# Patient Record
Sex: Female | Born: 1940
Health system: Southern US, Community
[De-identification: ages and names within clinical notes are randomized; demographics above are authoritative.]

## PROBLEM LIST (undated history)

## (undated) DIAGNOSIS — I10 Essential (primary) hypertension: Secondary | ICD-10-CM

## (undated) DIAGNOSIS — F329 Major depressive disorder, single episode, unspecified: Secondary | ICD-10-CM

## (undated) DIAGNOSIS — F32A Depression, unspecified: Secondary | ICD-10-CM

## (undated) DIAGNOSIS — K5790 Diverticulosis of intestine, part unspecified, without perforation or abscess without bleeding: Secondary | ICD-10-CM

## (undated) DIAGNOSIS — E785 Hyperlipidemia, unspecified: Secondary | ICD-10-CM

## (undated) DIAGNOSIS — M1990 Unspecified osteoarthritis, unspecified site: Secondary | ICD-10-CM

## (undated) HISTORY — PX: BACK SURGERY: SHX140

## (undated) HISTORY — DX: Essential (primary) hypertension: I10

## (undated) HISTORY — PX: COLONOSCOPY: SHX174

## (undated) HISTORY — DX: Unspecified osteoarthritis, unspecified site: M19.90

## (undated) HISTORY — DX: Diverticulosis of intestine, part unspecified, without perforation or abscess without bleeding: K57.90

## (undated) HISTORY — DX: Depression, unspecified: F32.A

## (undated) HISTORY — DX: Hyperlipidemia, unspecified: E78.5

---
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
## 2001-09-01 ENCOUNTER — Encounter: Admission: RE | Admit: 2001-09-01 | Discharge: 2001-09-01 | Payer: Self-pay | Admitting: Obstetrics and Gynecology

## 2001-09-01 ENCOUNTER — Encounter: Payer: Self-pay | Admitting: Obstetrics and Gynecology

## 2001-09-22 ENCOUNTER — Ambulatory Visit (HOSPITAL_COMMUNITY): Admission: RE | Admit: 2001-09-22 | Discharge: 2001-09-22 | Payer: Self-pay | Admitting: *Deleted

## 2002-08-24 ENCOUNTER — Other Ambulatory Visit: Admission: RE | Admit: 2002-08-24 | Discharge: 2002-08-24 | Payer: Self-pay | Admitting: Gynecology

## 2003-10-02 ENCOUNTER — Other Ambulatory Visit: Admission: RE | Admit: 2003-10-02 | Discharge: 2003-10-02 | Payer: Self-pay | Admitting: Gynecology

## 2005-04-06 ENCOUNTER — Emergency Department (HOSPITAL_COMMUNITY): Admission: EM | Admit: 2005-04-06 | Discharge: 2005-04-06 | Payer: Self-pay | Admitting: Emergency Medicine

## 2005-04-21 ENCOUNTER — Inpatient Hospital Stay (HOSPITAL_COMMUNITY): Admission: RE | Admit: 2005-04-21 | Discharge: 2005-04-22 | Payer: Self-pay | Admitting: Neurological Surgery

## 2006-09-24 ENCOUNTER — Other Ambulatory Visit: Admission: RE | Admit: 2006-09-24 | Discharge: 2006-09-24 | Payer: Self-pay | Admitting: Gynecology

## 2010-05-28 ENCOUNTER — Encounter: Payer: Self-pay | Admitting: Gastroenterology

## 2010-05-30 ENCOUNTER — Encounter: Admission: RE | Admit: 2010-05-30 | Discharge: 2010-05-30 | Payer: Self-pay | Admitting: Gastroenterology

## 2010-11-06 ENCOUNTER — Encounter (INDEPENDENT_AMBULATORY_CARE_PROVIDER_SITE_OTHER): Payer: Self-pay | Admitting: *Deleted

## 2010-12-06 ENCOUNTER — Encounter (INDEPENDENT_AMBULATORY_CARE_PROVIDER_SITE_OTHER): Payer: Self-pay | Admitting: *Deleted

## 2010-12-11 ENCOUNTER — Ambulatory Visit
Admission: RE | Admit: 2010-12-11 | Discharge: 2010-12-11 | Payer: Self-pay | Source: Home / Self Care | Attending: Internal Medicine | Admitting: Internal Medicine

## 2010-12-16 ENCOUNTER — Telehealth (INDEPENDENT_AMBULATORY_CARE_PROVIDER_SITE_OTHER): Payer: Self-pay | Admitting: *Deleted

## 2011-01-09 NOTE — Miscellaneous (Signed)
Summary: [facility] Previsit/prep  Clinical Lists Changes  Medications: Added new medication of MOVIPREP 100 GM  SOLR (PEG-KCL-NACL-NASULF-NA ASC-C) As per prep instructions. - Signed Rx of MOVIPREP 100 GM  SOLR (PEG-KCL-NACL-NASULF-NA ASC-C) As per prep instructions.;  #1 x 0;  Signed;  Entered by: Wyona Almas RN;  Authorized by: Rachael Fee MD;  Method used: Electronically to [facility]  [address] [place], [place], [place]  [address], Ph: [phone], Fax: [phone] Observations: Added new observation of NKA: T (12/11/2010 8:35)    Prescriptions: MOVIPREP 100 GM  SOLR (PEG-KCL-NACL-NASULF-NA ASC-C) As per prep instructions.  #1 x 0   Entered by:   Wyona Almas RN   Authorized by:   Rachael Fee MD   Signed by:   Wyona Almas RN on 12/11/2010   Method used:   Electronically to        [facility]  [address] [facility] (retail)       [address]       [place], [place]  [address]       Ph: [phone]       Fax: [phone]   RxID:   [id number]   Appended Document: [facility] Previsit/prep records release sent to [facility] for records

## 2011-01-09 NOTE — Letter (Signed)
Summary: Pre Visit Letter Revised  [facility]  [address] [place], [place] [address]   Phone: [phone]  Fax: [phone]        11/06/2010 MRN: [id number] [facility] [address] [place], [place]  [address]             Procedure Date:  12/23/2010   Welcome to the Gastroenterology Division at [facility].    You are scheduled to see a nurse for your pre-procedure visit on 12/11/10 at 9:00 AM on the 3rd floor at [facility], [address]  We ask that you try to arrive at our office 15 minutes prior to your appointment time to allow for check-in.  Please take a minute to review the attached form.  If you answer "Yes" to one or more of the questions on the first page, we ask that you call the person listed at your earliest opportunity.  If you answer "No" to all of the questions, please complete the rest of the form and bring it to your appointment.    Your nurse visit will consist of discussing your medical and surgical history, your immediate family medical history, and your medications.   If you are unable to list all of your medications on the form, please bring the medication bottles to your appointment and we will list them.  We will need to be aware of both prescribed and over the counter drugs.  We will need to know exact dosage information as well.    Please be prepared to read and sign documents such as consent forms, a financial agreement, and acknowledgement forms.  If necessary, and with your consent, a friend or relative is welcome to sit-in on the nurse visit with you.  Please bring your insurance card so that we may make a copy of it.  If your insurance requires a referral to see a specialist, please bring your referral form from your primary care physician.  No co-pay is required for this nurse visit.     If you cannot keep your appointment, please call [phone] to cancel or reschedule prior to your appointment date.  This allows [place] the  opportunity to schedule an appointment for another patient in need of care.    Thank you for choosing [facility] for your medical needs.  We appreciate the opportunity to care for you.  Please visit [place] at our website  to learn more about our practice.  Sincerely, The Gastroenterology Division

## 2011-01-09 NOTE — Letter (Signed)
Summary: [facility]  [facility]   Imported By: Sherian Rein 12/24/2010 11:05:05  _____________________________________________________________________  External Attachment:    Type:   Image     Comment:   External Document

## 2011-01-09 NOTE — Letter (Signed)
Summary: [facility] Instructions  [facility]  [address] [place], [place] [address]   Phone: [phone]  Fax: [phone]       Claire Garcia    [birth year]    MRN: [id number]        Procedure Day Dorna Bloom: [facility]  02/05/11     Arrival Time: 9:30AM AM     Procedure Time: 10:30AM     Location of Procedure:                    _x _  [facility] (4th Floor)                        PREPARATION FOR COLONOSCOPY WITH MOVIPREP   Starting 5 days prior to your procedure 01/31/11  do not eat nuts, seeds, popcorn, corn, beans, peas,  salads, or any raw vegetables.  Do not take any fiber supplements (e.g. Metamucil, Citrucel, and Benefiber).  THE DAY BEFORE YOUR PROCEDURE         DATE:  02/04/11   DAY:  TUESDAY  1.  Drink clear liquids the entire day-NO SOLID FOOD  2.  Do not drink anything colored red or purple.  Avoid juices with pulp.  No orange juice.  3.  Drink at least 64 oz. (8 glasses) of fluid/clear liquids during the day to prevent dehydration and help the prep work efficiently.  CLEAR LIQUIDS INCLUDE: Water Jello Ice Popsicles Tea (sugar ok, no milk/cream) Powdered fruit flavored drinks Coffee (sugar ok, no milk/cream) Gatorade Juice: apple, white grape, white cranberry  Lemonade Clear bullion, consomm, broth Carbonated beverages (any kind) Strained chicken noodle soup Hard Candy                             4.  In the morning, mix first dose of MoviPrep solution:    Empty 1 Pouch A and 1 Pouch B into the disposable container    Add lukewarm drinking water to the top line of the container. Mix to dissolve    Refrigerate (mixed solution should be used within 24 hrs)  5.  Begin drinking the prep at 5:00 p.m. The MoviPrep container is divided by 4 marks.   Every 15 minutes drink the solution down to the next mark (approximately 8 oz) until the full liter is complete.   6.  Follow completed prep with 16 oz of clear liquid of your choice  (Nothing red or purple).  Continue to drink clear liquids until bedtime.  7.  Before going to bed, mix second dose of MoviPrep solution:    Empty 1 Pouch A and 1 Pouch B into the disposable container    Add lukewarm drinking water to the top line of the container. Mix to dissolve    Refrigerate  THE DAY OF YOUR PROCEDURE      DATE:  02/05/11  DAY:  WEDNESDAY  Beginning at 5:30AM (5 hours before procedure):         1. Every 15 minutes, drink the solution down to the next mark (approx 8 oz) until the full liter is complete.  2. Follow completed prep with 16 oz. of clear liquid of your choice.    3. You may drink clear liquids until  8:30AM  (2 HOURS BEFORE PROCEDURE).   MEDICATION INSTRUCTIONS  Unless otherwise instructed, you should take regular prescription medications with a small sip of water  as early as possible the morning of your procedure.        OTHER INSTRUCTIONS  You will need a responsible adult at least 69 years of age to accompany you and drive you home.   This person must remain in the waiting room during your procedure.  Wear loose fitting clothing that is easily removed.  Leave jewelry and other valuables at home.  However, you may wish to bring a book to read or  an iPod/MP3 player to listen to music as you wait for your procedure to start.  Remove all body piercing jewelry and leave at home.  Total time from sign-in until discharge is approximately 2-3 hours.  You should go home directly after your procedure and rest.  You can resume normal activities the  day after your procedure.  The day of your procedure you should not:   Drive   Make legal decisions   Operate machinery   Drink alcohol   Return to work  You will receive specific instructions about eating, activities and medications before you leave.    The above instructions have been reviewed and explained to me by   Wyona Almas RN  December 11, 2010 9:27 AM     I fully  understand and can verbalize these instructions _____________________________ Date _________

## 2011-01-09 NOTE — Progress Notes (Signed)
Summary: Dr switch  Phone Note Outgoing Call   Call placed by: Chales Abrahams CMA Duncan Dull),  December 16, 2010 8:20 AM Summary of Call: Placed a call to the pt  to inquire about the switch from Dr Bosie Clos.  She originally saw Dr Luther Parody and was scheduled with Dr Bosie Clos who she did not care for and would like to switch to Dr Christella Hartigan.  Her husband Myka Hitz is also a pt of Dr Christella Hartigan.  Dr Christella Hartigan do you ok the switch?  The records are on your desk. Initial call taken by: Chales Abrahams CMA Duncan Dull),  December 16, 2010 8:23 AM  Follow-up for Phone Call        yes, that is ok Follow-up by: Rachael Fee MD,  December 16, 2010 8:31 AM  Additional Follow-up for Phone Call Additional follow up Details #1::        pt will remain on the schedule Additional Follow-up by: Chales Abrahams CMA Duncan Dull),  December 16, 2010 8:39 AM

## 2011-02-05 ENCOUNTER — Other Ambulatory Visit: Payer: Self-pay | Admitting: Gastroenterology

## 2011-03-04 ENCOUNTER — Encounter: Payer: Self-pay | Admitting: Gastroenterology

## 2011-03-05 ENCOUNTER — Encounter: Payer: Self-pay | Admitting: Gastroenterology

## 2011-03-05 ENCOUNTER — Ambulatory Visit (AMBULATORY_SURGERY_CENTER): Payer: MEDICARE | Admitting: Gastroenterology

## 2011-03-05 VITALS — BP 189/86 | HR 68 | Temp 98.4°F | Resp 17 | Ht 61.0 in | Wt 152.0 lb

## 2011-03-05 DIAGNOSIS — Q438 Other specified congenital malformations of intestine: Secondary | ICD-10-CM

## 2011-03-05 DIAGNOSIS — Z1211 Encounter for screening for malignant neoplasm of colon: Secondary | ICD-10-CM

## 2011-03-05 DIAGNOSIS — K644 Residual hemorrhoidal skin tags: Secondary | ICD-10-CM

## 2011-03-05 NOTE — Patient Instructions (Signed)
Please refer to blue/green papers for discharge instructions.

## 2011-03-05 NOTE — Progress Notes (Signed)
Upon discharge, pt. Complains of dizziness and feeling weak. Put pt. In recliner and gave her coke and saltines. Within 10 minutes, patient states she feels better and husband says he thinks she'll be fine to go home. Discharged patient to home.

## 2011-03-06 ENCOUNTER — Telehealth: Payer: Self-pay | Admitting: *Deleted

## 2011-03-06 ENCOUNTER — Telehealth: Payer: Self-pay

## 2011-03-06 DIAGNOSIS — Q438 Other specified congenital malformations of intestine: Secondary | ICD-10-CM

## 2011-03-06 NOTE — Telephone Encounter (Signed)

## 2011-03-06 NOTE — Telephone Encounter (Signed)
Pt aware of the test and that she needs to pick up contrast at [facility], [place] prior to procedure

## 2011-03-06 NOTE — Telephone Encounter (Signed)
Pt scheduled for double contrast barium enema at [facility] radiology on 03/12/11 9 am pt to arrive at 845 am and go to [facility] to pick up contrast and instructions prior.  Left message on machine to call back

## 2011-03-11 NOTE — Procedures (Signed)
Summary: Colonoscopy  Patient: Claire Garcia Note: All result statuses are Final unless otherwise noted.  Tests: (1) Colonoscopy (COL)   COL Colonoscopy           DONE      [facility]     [address]     [place], [place]  [address]          COLONOSCOPY PROCEDURE REPORT          PATIENT:  [name], [name]  MR#:  [id number]     BIRTHDATE:  1941-11-26, 69 yrs. old  GENDER:  female     ENDOSCOPIST:  Rachael Fee, MD     PROCEDURE DATE:  03/05/2011     REFERRING:  Shannan Harper, MD     PROCEDURE:  Incomplete colonoscopy     ASA CLASS:  Class II     INDICATIONS:  Routine Risk Screening, colonoscopy >10 years ago     per patient was normal     MEDICATIONS:   Fentanyl 125 mcg IV, Versed 12 mg IV          DESCRIPTION OF PROCEDURE:   After the risks benefits and     alternatives of the procedure were thoroughly explained, informed     consent was obtained.  Digital rectal exam was performed and     revealed no rectal masses.   The LB PCF-H180AL [id number] endoscope     was introduced through the anus and advanced to the ascending     colon, limited by a redundant colon, extreme patient discomfort.     The quality of the prep was adequate, using MoviPrep.  The     instrument was then slowly withdrawn as the colon was fully     examined.<<PROCEDUREIMAGES>>     FINDINGS:  External Hemorrhoids were found.  This was otherwise a     normal examination of the colon (see image2, image3, and image4).     Retroflexed views in the rectum revealed no abnormalities.    The     scope was then withdrawn from the patient and the procedure     completed.     COMPLICATIONS:  None          ENDOSCOPIC IMPRESSION:     1) External hemorrhoids     2) Otherwise normal examination that was limited due to patient     discomfort, redundant colon (scope only advanced to the ascending     colon, did not evaluate the cecum)          RECOMMENDATIONS:     My office will call you at home to set up  a double contrast     Barium enema to complete the examination of your colon (only the     cecum was not visible on this procedure)          ______________________________     Rachael Fee, MD          n.     eSIGNED:   Rachael Fee at 03/05/2011 09:45 AM          Joselyn Glassman, [id number]  Note: An exclamation mark (!) indicates a result that was not dispersed into the flowsheet. Document Creation Date: 03/05/2011 9:45 AM _______________________________________________________________________  (1) Order result status: Final Collection or observation date-time: 03/05/2011 09:38 Requested date-time:  Receipt date-time:  Reported date-time:  Referring Physician:   Ordering Physician: Rob Bunting [phone]) Specimen Source:  Source: Launa Grill Order Number: [id number] Lab site:

## 2011-03-12 ENCOUNTER — Ambulatory Visit (HOSPITAL_COMMUNITY)
Admission: RE | Admit: 2011-03-12 | Discharge: 2011-03-12 | Disposition: A | Discharge status: Home / Self Care | Payer: MEDICARE | Source: Ambulatory Visit | Attending: Gastroenterology | Admitting: Gastroenterology

## 2011-03-12 DIAGNOSIS — K5730 Diverticulosis of large intestine without perforation or abscess without bleeding: Secondary | ICD-10-CM | POA: Insufficient documentation

## 2011-03-12 DIAGNOSIS — Q438 Other specified congenital malformations of intestine: Secondary | ICD-10-CM

## 2011-03-12 DIAGNOSIS — R109 Unspecified abdominal pain: Secondary | ICD-10-CM | POA: Insufficient documentation

## 2011-03-17 ENCOUNTER — Telehealth: Payer: Self-pay | Admitting: Gastroenterology

## 2011-03-17 NOTE — Telephone Encounter (Signed)
LMOM for pt to call back.

## 2011-03-17 NOTE — Telephone Encounter (Signed)
Pt wanted results of her scan and I informed her all I can tell her is she had no definite stricture or large polyp; mild diverticulitis. Dr Christella Hartigan will give more results when he returns. Pt stated understanding.

## 2011-03-27 ENCOUNTER — Telehealth: Payer: Self-pay | Admitting: Gastroenterology

## 2011-03-27 NOTE — Telephone Encounter (Signed)
I did advise Claire Garcia that the results are not available and I will call her after Dr Christella Hartigan reviews

## 2011-03-31 ENCOUNTER — Telehealth: Payer: Self-pay | Admitting: Gastroenterology

## 2011-04-01 NOTE — Telephone Encounter (Signed)
Claire Garcia, Not sure what happened. Please apologize to her, explain epic transition. BE looks fine, no polyps, cancers: she needs recall colonoscopy in 10 years. Thanks  Dan   Pt aware and recall added to [facility]

## 2011-04-25 NOTE — Op Note (Signed)
Claire Garcia, Claire Garcia                ACCOUNT NO.:  [id number]   MEDICAL RECORD NO.:  [id number]          PATIENT TYPE:  INP   LOCATION:  3023                         FACILITY:  [facility]   PHYSICIAN:  Stefani Dama, M.D.  DATE OF BIRTH:  19-Jan-1941   DATE OF PROCEDURE:  04/21/2005  DATE OF DISCHARGE:                                 OPERATIVE REPORT   PREOPERATIVE DIAGNOSIS:  Herniated nucleus pulposus L5-S1 left with left  lumbar radiculopathy.   POSTOPERATIVE DIAGNOSIS:  Herniated nucleus pulposus L5-S1 left with left  lumbar radiculopathy.   PROCEDURE:  Left with lumbar laminotomy and microdiskectomy L5-S1 with  operating microscope microdissection technique.   SURGEON:  Stefani Dama, M.D.   FIRST ASSISTANT:  Hilda Lias, M.D.   ANESTHESIA:  General tracheal.   INDICATIONS:  Ms. Claire Garcia is a 70 year old individual who has had  significant back and left lower extremity pain.  She has evidence of  herniated nucleus pulposus L5-S1 on the left side and this failed extensive  efforts and the passage of time to see if it would heal conservatively. She  has been advised regarding surgical decompression.   PROCEDURE:  The patient was brought to the operating room supine on the  stretcher. After smooth induction of general endotracheal anesthesia, was  turned prone.  Her back was shaved, prepped with DuraPrep and draped in  sterile fashion. A midline incision was made overlying the L5-S1 area which  was localized preoperatively with radiograph.  Dissection was carried down  to the lumbodorsal fascia which was opened on the left side of midline and  the interspinous space and interspinous ligament at L5-S1 was cleared in a  subperiosteal fashion. The yellow ligament was taken down.  Laminotomy was  created removing the inferior margin of lamina of L5 out to the mesial wall  facette.  As the yellow ligament was taken up, there was noted to be some  fascial adhesions here in  the lateral recess and careful dissection was  performed so as not to disturb or compress the common dural tube. After  peeling some fascial adhesions that had occurred in this area, it was noted  that the disk space itself was significantly tented dorsally and medially  from an underlying mass which, with further dissection, was found to be  several free fragments of disk material. Once these were mobilized and  removed, it allowed for some decompression of the common dural tube,  however, there were still adherent portions of the disk material that were  in the opening of the disk space itself.  These were cleaned and the disk  space was gradually entered, removing significant quantities of degenerated  disk material from within the disk space itself.  The right medial aspect of  the posterior longitudinal ligament was cleared at the area of the dural  tear.  There was noted to be some osteophytic overgrowth from this region.  This was taken down with an osteophytectomy tool. Once the lateral recess  and the disk space were cleared out completely of all the remaining disk  fragments, the  wound was explored and carefully checked for integrity of the  dura, which was found to be the case and also the disk space was probed to  make sure that there were no other fragments of disk within the disk space  that where loose or in a position to potentially herniate out.  Once the  disk space was secured this fashion, then 25 mcg of fentanyl along with 30  mg of Depo-Medrol was left in the epidural space. The retractor was removed.  The microscope was removed and then the lumbodorsal fascia was closed with  #1 Vicryl interrupted fashion, 2-0 Vicryl was used in the  subcutaneous  tissues and 3-0 Vicryl subcuticularly. Dermabond was placed on the skin. The  patient tolerated procedure well and was returned to recovery room in stable  condition.       HJE/MEDQ  D:  04/21/2005  T:  04/21/2005  Job:   [id number]

## 2015-09-13 DIAGNOSIS — M9901 Segmental and somatic dysfunction of cervical region: Secondary | ICD-10-CM | POA: Diagnosis not present

## 2015-09-13 DIAGNOSIS — M9903 Segmental and somatic dysfunction of lumbar region: Secondary | ICD-10-CM | POA: Diagnosis not present

## 2015-09-13 DIAGNOSIS — M9902 Segmental and somatic dysfunction of thoracic region: Secondary | ICD-10-CM | POA: Diagnosis not present

## 2015-09-13 DIAGNOSIS — I973 Postprocedural hypertension: Secondary | ICD-10-CM | POA: Diagnosis not present

## 2015-09-13 DIAGNOSIS — M5137 Other intervertebral disc degeneration, lumbosacral region: Secondary | ICD-10-CM | POA: Diagnosis not present

## 2015-10-02 DIAGNOSIS — L570 Actinic keratosis: Secondary | ICD-10-CM | POA: Diagnosis not present

## 2015-10-02 DIAGNOSIS — L719 Rosacea, unspecified: Secondary | ICD-10-CM | POA: Diagnosis not present

## 2015-10-10 DIAGNOSIS — M9901 Segmental and somatic dysfunction of cervical region: Secondary | ICD-10-CM | POA: Diagnosis not present

## 2015-10-10 DIAGNOSIS — M5137 Other intervertebral disc degeneration, lumbosacral region: Secondary | ICD-10-CM | POA: Diagnosis not present

## 2015-10-10 DIAGNOSIS — M9902 Segmental and somatic dysfunction of thoracic region: Secondary | ICD-10-CM | POA: Diagnosis not present

## 2015-10-10 DIAGNOSIS — M9903 Segmental and somatic dysfunction of lumbar region: Secondary | ICD-10-CM | POA: Diagnosis not present

## 2015-10-10 DIAGNOSIS — I973 Postprocedural hypertension: Secondary | ICD-10-CM | POA: Diagnosis not present

## 2015-12-03 DIAGNOSIS — J019 Acute sinusitis, unspecified: Secondary | ICD-10-CM | POA: Diagnosis not present

## 2015-12-03 DIAGNOSIS — J302 Other seasonal allergic rhinitis: Secondary | ICD-10-CM | POA: Diagnosis not present

## 2015-12-11 DIAGNOSIS — Z1231 Encounter for screening mammogram for malignant neoplasm of breast: Secondary | ICD-10-CM | POA: Diagnosis not present

## 2015-12-19 DIAGNOSIS — L719 Rosacea, unspecified: Secondary | ICD-10-CM | POA: Diagnosis not present

## 2016-03-10 DIAGNOSIS — H524 Presbyopia: Secondary | ICD-10-CM | POA: Diagnosis not present

## 2016-03-10 DIAGNOSIS — H52229 Regular astigmatism, unspecified eye: Secondary | ICD-10-CM | POA: Diagnosis not present

## 2016-03-11 DIAGNOSIS — J209 Acute bronchitis, unspecified: Secondary | ICD-10-CM | POA: Diagnosis not present

## 2016-03-11 DIAGNOSIS — J302 Other seasonal allergic rhinitis: Secondary | ICD-10-CM | POA: Diagnosis not present

## 2016-03-14 DIAGNOSIS — K5730 Diverticulosis of large intestine without perforation or abscess without bleeding: Secondary | ICD-10-CM | POA: Diagnosis not present

## 2016-03-14 DIAGNOSIS — E782 Mixed hyperlipidemia: Secondary | ICD-10-CM | POA: Diagnosis not present

## 2016-03-14 DIAGNOSIS — I10 Essential (primary) hypertension: Secondary | ICD-10-CM | POA: Diagnosis not present

## 2016-03-17 DIAGNOSIS — R69 Illness, unspecified: Secondary | ICD-10-CM | POA: Diagnosis not present

## 2016-03-19 DIAGNOSIS — E782 Mixed hyperlipidemia: Secondary | ICD-10-CM | POA: Diagnosis not present

## 2016-03-19 DIAGNOSIS — I10 Essential (primary) hypertension: Secondary | ICD-10-CM | POA: Diagnosis not present

## 2016-03-19 DIAGNOSIS — R69 Illness, unspecified: Secondary | ICD-10-CM | POA: Diagnosis not present

## 2016-04-07 DIAGNOSIS — 419620001 Death: Secondary | SNOMED CT | POA: Diagnosis not present

## 2016-04-07 DEATH — deceased

## 2016-06-16 DIAGNOSIS — L570 Actinic keratosis: Secondary | ICD-10-CM | POA: Diagnosis not present

## 2016-06-16 DIAGNOSIS — L719 Rosacea, unspecified: Secondary | ICD-10-CM | POA: Diagnosis not present

## 2016-06-16 DIAGNOSIS — Z85828 Personal history of other malignant neoplasm of skin: Secondary | ICD-10-CM | POA: Diagnosis not present

## 2016-09-10 DIAGNOSIS — I10 Essential (primary) hypertension: Secondary | ICD-10-CM | POA: Diagnosis not present

## 2016-09-10 DIAGNOSIS — E782 Mixed hyperlipidemia: Secondary | ICD-10-CM | POA: Diagnosis not present

## 2016-09-10 DIAGNOSIS — R42 Dizziness and giddiness: Secondary | ICD-10-CM | POA: Diagnosis not present

## 2016-09-10 DIAGNOSIS — E559 Vitamin D deficiency, unspecified: Secondary | ICD-10-CM | POA: Diagnosis not present

## 2016-09-15 DIAGNOSIS — E782 Mixed hyperlipidemia: Secondary | ICD-10-CM | POA: Diagnosis not present

## 2016-09-15 DIAGNOSIS — H81393 Other peripheral vertigo, bilateral: Secondary | ICD-10-CM | POA: Diagnosis not present

## 2016-09-15 DIAGNOSIS — Z23 Encounter for immunization: Secondary | ICD-10-CM | POA: Diagnosis not present

## 2016-09-15 DIAGNOSIS — R69 Illness, unspecified: Secondary | ICD-10-CM | POA: Diagnosis not present

## 2016-09-15 DIAGNOSIS — I10 Essential (primary) hypertension: Secondary | ICD-10-CM | POA: Diagnosis not present

## 2016-09-15 DIAGNOSIS — Z6829 Body mass index (BMI) 29.0-29.9, adult: Secondary | ICD-10-CM | POA: Diagnosis not present

## 2016-09-15 DIAGNOSIS — J302 Other seasonal allergic rhinitis: Secondary | ICD-10-CM | POA: Diagnosis not present

## 2016-09-15 DIAGNOSIS — E559 Vitamin D deficiency, unspecified: Secondary | ICD-10-CM | POA: Diagnosis not present

## 2016-12-19 DIAGNOSIS — I10 Essential (primary) hypertension: Secondary | ICD-10-CM | POA: Diagnosis not present

## 2016-12-19 DIAGNOSIS — R69 Illness, unspecified: Secondary | ICD-10-CM | POA: Diagnosis not present

## 2016-12-19 DIAGNOSIS — Z6829 Body mass index (BMI) 29.0-29.9, adult: Secondary | ICD-10-CM | POA: Diagnosis not present

## 2017-01-05 DIAGNOSIS — Z1231 Encounter for screening mammogram for malignant neoplasm of breast: Secondary | ICD-10-CM | POA: Diagnosis not present

## 2017-01-14 DIAGNOSIS — H5200 Hypermetropia, unspecified eye: Secondary | ICD-10-CM | POA: Diagnosis not present

## 2017-01-14 DIAGNOSIS — H2510 Age-related nuclear cataract, unspecified eye: Secondary | ICD-10-CM | POA: Diagnosis not present

## 2017-01-14 DIAGNOSIS — Z0100 Encounter for examination of eyes and vision without abnormal findings: Secondary | ICD-10-CM | POA: Diagnosis not present

## 2017-03-12 DIAGNOSIS — I10 Essential (primary) hypertension: Secondary | ICD-10-CM | POA: Diagnosis not present

## 2017-03-12 DIAGNOSIS — E782 Mixed hyperlipidemia: Secondary | ICD-10-CM | POA: Diagnosis not present

## 2017-03-12 DIAGNOSIS — K5730 Diverticulosis of large intestine without perforation or abscess without bleeding: Secondary | ICD-10-CM | POA: Diagnosis not present

## 2017-03-17 DIAGNOSIS — R69 Illness, unspecified: Secondary | ICD-10-CM | POA: Diagnosis not present

## 2017-03-17 DIAGNOSIS — Z6829 Body mass index (BMI) 29.0-29.9, adult: Secondary | ICD-10-CM | POA: Diagnosis not present

## 2017-03-24 DIAGNOSIS — I10 Essential (primary) hypertension: Secondary | ICD-10-CM | POA: Diagnosis not present

## 2017-03-24 DIAGNOSIS — Z6829 Body mass index (BMI) 29.0-29.9, adult: Secondary | ICD-10-CM | POA: Diagnosis not present

## 2017-03-24 DIAGNOSIS — E559 Vitamin D deficiency, unspecified: Secondary | ICD-10-CM | POA: Diagnosis not present

## 2017-03-24 DIAGNOSIS — Z0001 Encounter for general adult medical examination with abnormal findings: Secondary | ICD-10-CM | POA: Diagnosis not present

## 2017-03-24 DIAGNOSIS — H81393 Other peripheral vertigo, bilateral: Secondary | ICD-10-CM | POA: Diagnosis not present

## 2017-03-24 DIAGNOSIS — K5730 Diverticulosis of large intestine without perforation or abscess without bleeding: Secondary | ICD-10-CM | POA: Diagnosis not present

## 2017-03-24 DIAGNOSIS — E782 Mixed hyperlipidemia: Secondary | ICD-10-CM | POA: Diagnosis not present

## 2017-03-24 DIAGNOSIS — R69 Illness, unspecified: Secondary | ICD-10-CM | POA: Diagnosis not present

## 2017-04-02 DIAGNOSIS — R69 Illness, unspecified: Secondary | ICD-10-CM | POA: Diagnosis not present

## 2017-04-02 DIAGNOSIS — L97821 Non-pressure chronic ulcer of other part of left lower leg limited to breakdown of skin: Secondary | ICD-10-CM | POA: Diagnosis not present

## 2017-04-02 DIAGNOSIS — I639 Cerebral infarction, unspecified: Secondary | ICD-10-CM | POA: Diagnosis not present

## 2017-04-13 DIAGNOSIS — L570 Actinic keratosis: Secondary | ICD-10-CM | POA: Diagnosis not present

## 2017-04-13 DIAGNOSIS — L821 Other seborrheic keratosis: Secondary | ICD-10-CM | POA: Diagnosis not present

## 2017-05-26 DIAGNOSIS — R3915 Urgency of urination: Secondary | ICD-10-CM | POA: Diagnosis not present

## 2017-05-26 DIAGNOSIS — Z6828 Body mass index (BMI) 28.0-28.9, adult: Secondary | ICD-10-CM | POA: Diagnosis not present

## 2017-08-04 DIAGNOSIS — Z85828 Personal history of other malignant neoplasm of skin: Secondary | ICD-10-CM | POA: Diagnosis not present

## 2017-08-04 DIAGNOSIS — L570 Actinic keratosis: Secondary | ICD-10-CM | POA: Diagnosis not present

## 2017-08-04 DIAGNOSIS — L719 Rosacea, unspecified: Secondary | ICD-10-CM | POA: Diagnosis not present

## 2017-09-11 DIAGNOSIS — E782 Mixed hyperlipidemia: Secondary | ICD-10-CM | POA: Diagnosis not present

## 2017-09-11 DIAGNOSIS — E559 Vitamin D deficiency, unspecified: Secondary | ICD-10-CM | POA: Diagnosis not present

## 2017-09-11 DIAGNOSIS — I10 Essential (primary) hypertension: Secondary | ICD-10-CM | POA: Diagnosis not present

## 2017-09-15 DIAGNOSIS — K5730 Diverticulosis of large intestine without perforation or abscess without bleeding: Secondary | ICD-10-CM | POA: Diagnosis not present

## 2017-09-15 DIAGNOSIS — E782 Mixed hyperlipidemia: Secondary | ICD-10-CM | POA: Diagnosis not present

## 2017-09-15 DIAGNOSIS — H81393 Other peripheral vertigo, bilateral: Secondary | ICD-10-CM | POA: Diagnosis not present

## 2017-09-15 DIAGNOSIS — Z23 Encounter for immunization: Secondary | ICD-10-CM | POA: Diagnosis not present

## 2017-09-15 DIAGNOSIS — I10 Essential (primary) hypertension: Secondary | ICD-10-CM | POA: Diagnosis not present

## 2017-09-15 DIAGNOSIS — Z6829 Body mass index (BMI) 29.0-29.9, adult: Secondary | ICD-10-CM | POA: Diagnosis not present

## 2017-09-15 DIAGNOSIS — R69 Illness, unspecified: Secondary | ICD-10-CM | POA: Diagnosis not present

## 2017-09-15 DIAGNOSIS — R3915 Urgency of urination: Secondary | ICD-10-CM | POA: Diagnosis not present

## 2017-12-24 DIAGNOSIS — Z23 Encounter for immunization: Secondary | ICD-10-CM | POA: Diagnosis not present

## 2018-01-11 DIAGNOSIS — Z1231 Encounter for screening mammogram for malignant neoplasm of breast: Secondary | ICD-10-CM | POA: Diagnosis not present

## 2018-01-19 DIAGNOSIS — D485 Neoplasm of uncertain behavior of skin: Secondary | ICD-10-CM | POA: Diagnosis not present

## 2018-01-19 DIAGNOSIS — J0101 Acute recurrent maxillary sinusitis: Secondary | ICD-10-CM | POA: Diagnosis not present

## 2018-01-19 DIAGNOSIS — Z6829 Body mass index (BMI) 29.0-29.9, adult: Secondary | ICD-10-CM | POA: Diagnosis not present

## 2018-01-19 DIAGNOSIS — L259 Unspecified contact dermatitis, unspecified cause: Secondary | ICD-10-CM | POA: Diagnosis not present

## 2018-01-26 DIAGNOSIS — Z6829 Body mass index (BMI) 29.0-29.9, adult: Secondary | ICD-10-CM | POA: Diagnosis not present

## 2018-01-26 DIAGNOSIS — J019 Acute sinusitis, unspecified: Secondary | ICD-10-CM | POA: Diagnosis not present

## 2018-01-26 DIAGNOSIS — J302 Other seasonal allergic rhinitis: Secondary | ICD-10-CM | POA: Diagnosis not present

## 2018-02-03 DIAGNOSIS — Z0100 Encounter for examination of eyes and vision without abnormal findings: Secondary | ICD-10-CM | POA: Diagnosis not present

## 2018-02-03 DIAGNOSIS — I10 Essential (primary) hypertension: Secondary | ICD-10-CM | POA: Diagnosis not present

## 2018-02-03 DIAGNOSIS — H5200 Hypermetropia, unspecified eye: Secondary | ICD-10-CM | POA: Diagnosis not present

## 2018-03-25 DIAGNOSIS — E782 Mixed hyperlipidemia: Secondary | ICD-10-CM | POA: Diagnosis not present

## 2018-03-25 DIAGNOSIS — I10 Essential (primary) hypertension: Secondary | ICD-10-CM | POA: Diagnosis not present

## 2018-05-06 DIAGNOSIS — Z0001 Encounter for general adult medical examination with abnormal findings: Secondary | ICD-10-CM | POA: Diagnosis not present

## 2018-05-06 DIAGNOSIS — E870 Hyperosmolality and hypernatremia: Secondary | ICD-10-CM | POA: Diagnosis not present

## 2018-05-06 DIAGNOSIS — Z6828 Body mass index (BMI) 28.0-28.9, adult: Secondary | ICD-10-CM | POA: Diagnosis not present

## 2018-05-06 DIAGNOSIS — J302 Other seasonal allergic rhinitis: Secondary | ICD-10-CM | POA: Diagnosis not present

## 2018-05-06 DIAGNOSIS — H81393 Other peripheral vertigo, bilateral: Secondary | ICD-10-CM | POA: Diagnosis not present

## 2018-05-06 DIAGNOSIS — E559 Vitamin D deficiency, unspecified: Secondary | ICD-10-CM | POA: Diagnosis not present

## 2018-05-06 DIAGNOSIS — K5730 Diverticulosis of large intestine without perforation or abscess without bleeding: Secondary | ICD-10-CM | POA: Diagnosis not present

## 2018-05-06 DIAGNOSIS — E782 Mixed hyperlipidemia: Secondary | ICD-10-CM | POA: Diagnosis not present

## 2018-05-06 DIAGNOSIS — I10 Essential (primary) hypertension: Secondary | ICD-10-CM | POA: Diagnosis not present

## 2018-05-06 DIAGNOSIS — R69 Illness, unspecified: Secondary | ICD-10-CM | POA: Diagnosis not present

## 2018-06-03 DIAGNOSIS — Z6829 Body mass index (BMI) 29.0-29.9, adult: Secondary | ICD-10-CM | POA: Diagnosis not present

## 2018-06-03 DIAGNOSIS — J0101 Acute recurrent maxillary sinusitis: Secondary | ICD-10-CM | POA: Diagnosis not present

## 2018-06-03 DIAGNOSIS — R42 Dizziness and giddiness: Secondary | ICD-10-CM | POA: Diagnosis not present

## 2018-06-03 DIAGNOSIS — R69 Illness, unspecified: Secondary | ICD-10-CM | POA: Diagnosis not present

## 2018-08-03 DIAGNOSIS — L570 Actinic keratosis: Secondary | ICD-10-CM | POA: Diagnosis not present

## 2018-09-09 DIAGNOSIS — S8012XA Contusion of left lower leg, initial encounter: Secondary | ICD-10-CM | POA: Diagnosis not present

## 2018-09-09 DIAGNOSIS — Z23 Encounter for immunization: Secondary | ICD-10-CM | POA: Diagnosis not present

## 2018-09-09 DIAGNOSIS — Z6829 Body mass index (BMI) 29.0-29.9, adult: Secondary | ICD-10-CM | POA: Diagnosis not present

## 2018-10-18 DIAGNOSIS — Z6830 Body mass index (BMI) 30.0-30.9, adult: Secondary | ICD-10-CM | POA: Diagnosis not present

## 2018-10-18 DIAGNOSIS — J0101 Acute recurrent maxillary sinusitis: Secondary | ICD-10-CM | POA: Diagnosis not present

## 2018-10-18 DIAGNOSIS — I10 Essential (primary) hypertension: Secondary | ICD-10-CM | POA: Diagnosis not present

## 2018-10-21 DIAGNOSIS — K5730 Diverticulosis of large intestine without perforation or abscess without bleeding: Secondary | ICD-10-CM | POA: Diagnosis not present

## 2018-10-21 DIAGNOSIS — J302 Other seasonal allergic rhinitis: Secondary | ICD-10-CM | POA: Diagnosis not present

## 2018-10-21 DIAGNOSIS — H81393 Other peripheral vertigo, bilateral: Secondary | ICD-10-CM | POA: Diagnosis not present

## 2018-10-21 DIAGNOSIS — Z6830 Body mass index (BMI) 30.0-30.9, adult: Secondary | ICD-10-CM | POA: Diagnosis not present

## 2018-10-21 DIAGNOSIS — R69 Illness, unspecified: Secondary | ICD-10-CM | POA: Diagnosis not present

## 2018-10-21 DIAGNOSIS — E870 Hyperosmolality and hypernatremia: Secondary | ICD-10-CM | POA: Diagnosis not present

## 2018-10-21 DIAGNOSIS — E782 Mixed hyperlipidemia: Secondary | ICD-10-CM | POA: Diagnosis not present

## 2018-10-21 DIAGNOSIS — I10 Essential (primary) hypertension: Secondary | ICD-10-CM | POA: Diagnosis not present

## 2018-10-21 DIAGNOSIS — E559 Vitamin D deficiency, unspecified: Secondary | ICD-10-CM | POA: Diagnosis not present

## 2019-01-10 DIAGNOSIS — J019 Acute sinusitis, unspecified: Secondary | ICD-10-CM | POA: Diagnosis not present

## 2019-01-10 DIAGNOSIS — Z6830 Body mass index (BMI) 30.0-30.9, adult: Secondary | ICD-10-CM | POA: Diagnosis not present

## 2019-02-01 DIAGNOSIS — Z1231 Encounter for screening mammogram for malignant neoplasm of breast: Secondary | ICD-10-CM | POA: Diagnosis not present

## 2019-02-02 DIAGNOSIS — L821 Other seborrheic keratosis: Secondary | ICD-10-CM | POA: Diagnosis not present

## 2019-02-02 DIAGNOSIS — Z85828 Personal history of other malignant neoplasm of skin: Secondary | ICD-10-CM | POA: Diagnosis not present

## 2019-02-02 DIAGNOSIS — L570 Actinic keratosis: Secondary | ICD-10-CM | POA: Diagnosis not present

## 2019-02-14 DIAGNOSIS — H5200 Hypermetropia, unspecified eye: Secondary | ICD-10-CM | POA: Diagnosis not present

## 2019-02-14 DIAGNOSIS — I10 Essential (primary) hypertension: Secondary | ICD-10-CM | POA: Diagnosis not present

## 2019-02-14 DIAGNOSIS — Z0100 Encounter for examination of eyes and vision without abnormal findings: Secondary | ICD-10-CM | POA: Diagnosis not present

## 2019-05-09 DIAGNOSIS — E782 Mixed hyperlipidemia: Secondary | ICD-10-CM | POA: Diagnosis not present

## 2019-05-09 DIAGNOSIS — E870 Hyperosmolality and hypernatremia: Secondary | ICD-10-CM | POA: Diagnosis not present

## 2019-05-09 DIAGNOSIS — I10 Essential (primary) hypertension: Secondary | ICD-10-CM | POA: Diagnosis not present

## 2019-05-12 DIAGNOSIS — E870 Hyperosmolality and hypernatremia: Secondary | ICD-10-CM | POA: Diagnosis not present

## 2019-05-12 DIAGNOSIS — Z6830 Body mass index (BMI) 30.0-30.9, adult: Secondary | ICD-10-CM | POA: Diagnosis not present

## 2019-05-12 DIAGNOSIS — E559 Vitamin D deficiency, unspecified: Secondary | ICD-10-CM | POA: Diagnosis not present

## 2019-05-12 DIAGNOSIS — I10 Essential (primary) hypertension: Secondary | ICD-10-CM | POA: Diagnosis not present

## 2019-05-12 DIAGNOSIS — E782 Mixed hyperlipidemia: Secondary | ICD-10-CM | POA: Diagnosis not present

## 2019-05-12 DIAGNOSIS — K5730 Diverticulosis of large intestine without perforation or abscess without bleeding: Secondary | ICD-10-CM | POA: Diagnosis not present

## 2019-05-12 DIAGNOSIS — R69 Illness, unspecified: Secondary | ICD-10-CM | POA: Diagnosis not present

## 2019-05-12 DIAGNOSIS — Z0001 Encounter for general adult medical examination with abnormal findings: Secondary | ICD-10-CM | POA: Diagnosis not present

## 2019-05-19 DIAGNOSIS — Z1211 Encounter for screening for malignant neoplasm of colon: Secondary | ICD-10-CM | POA: Diagnosis not present

## 2019-05-19 DIAGNOSIS — Z1212 Encounter for screening for malignant neoplasm of rectum: Secondary | ICD-10-CM | POA: Diagnosis not present

## 2019-06-22 ENCOUNTER — Ambulatory Visit (INDEPENDENT_AMBULATORY_CARE_PROVIDER_SITE_OTHER): Payer: Medicare HMO | Admitting: Gastroenterology

## 2019-06-22 ENCOUNTER — Encounter: Payer: Self-pay | Admitting: Gastroenterology

## 2019-06-22 VITALS — Ht 61.0 in | Wt 153.0 lb

## 2019-06-22 DIAGNOSIS — R195 Other fecal abnormalities: Secondary | ICD-10-CM | POA: Diagnosis not present

## 2019-06-22 MED ORDER — PEG 3350-KCL-NA BICARB-NACL 420 G PO SOLR: 4000.0000 mL | ORAL | 0 refills | Status: DC

## 2019-06-22 NOTE — Patient Instructions (Addendum)
We will arrange a colonoscopy for positive Cologuard colon cancer screening test.  Thank you for entrusting me with your care and choosing [facility].  Dr Ardis Hughs

## 2019-06-22 NOTE — Progress Notes (Signed)
Review of pertinent gastrointestinal problems: 1.  Routine risk for colon cancer.  Colonoscopy Dr. Ardis Hughs March 2012 for routine risk colon cancer screening.  External hemorrhoids were noted.  The examination was otherwise normal but was limited due to patient discomfort and redundant colon.  The scope only advanced to the ascending colon and did not evaluate the base of the cecum.  Barium enema April 2012 was normal.   This service was provided via virtual visit.   Only audio was used.  The patient was located at home.  I was located in my office.  The patient did consent to this virtual visit and is aware of possible charges through their insurance for this visit.  I last saw her about 8 years ago, she is considered a new patient to therefore.  My certified medical assistant, Grace Bushy, contributed to this visit by contacting the patient by phone 1 or 2 business days prior to the appointment and also followed up on the recommendations I made after the visit.  Time spent on virtual visit: 24 minutes   HPI: This is a very pleasant 78 year old woman whom I last saw about 8 years ago at the time of colonoscopy.  She was referred by her primary care physician Dr. Pleas Koch at [facility] in [place].  Her PCP ( [place] [facility] in [place]) did a Cologuard colon cancer screening test for her recently..   No GI issues.  No bleeding, no changes she is very concerned about. No serious abdominal pains.  No FH of colon cancer.  Chief complaint is Cologuard positive stool test  ROS: complete GI ROS as described in HPI, all other review negative.  Constitutional:  No unintentional weight loss   Past Medical History:  Diagnosis Date  . Arthritis   . Hypertension     Past Surgical History:  Procedure Laterality Date  . BACK SURGERY      Current Outpatient Medications  Medication Sig Dispense Refill  . ALPRAZolam (XANAX) 0.25 MG tablet Take 0.25 mg by mouth daily as needed for anxiety.    .  Cyanocobalamin (VITAMIN B-12 PO) Take 2,500 mcg by mouth daily.      Marland Kitchen lisinopril (PRINIVIL,ZESTRIL) 10 MG tablet Take 10 mg by mouth daily.      . metoprolol (TOPROL-XL) 50 MG 24 hr tablet Take 25 mg by mouth daily.     . Naproxen Sodium (ALEVE PO) Take by mouth as needed.      . rosuvastatin (CRESTOR) 20 MG tablet Take 20 mg by mouth daily. Take on Monday and Friday     No current facility-administered medications for this visit.     Allergies as of 06/22/2019  . (No Known Allergies)    No family history on file.  Social History   Socioeconomic History  . Marital status: Married    Spouse name: Not on file  . Number of children: Not on file  . Years of education: Not on file  . Highest education level: Not on file  Occupational History  . Not on file  Social Needs  . Financial resource strain: Not on file  . Food insecurity    Worry: Not on file    Inability: Not on file  . Transportation needs    Medical: Not on file    Non-medical: Not on file  Tobacco Use  . Smoking status: Never Smoker  . Smokeless tobacco: Never Used  Substance and Sexual Activity  . Alcohol use: No  . Drug use:  No  . Sexual activity: Not on file  Lifestyle  . Physical activity    Days per week: Not on file    Minutes per session: Not on file  . Stress: Not on file  Relationships  . Social Herbalist on phone: Not on file    Gets together: Not on file    Attends religious service: Not on file    Active member of club or organization: Not on file    Attends meetings of clubs or organizations: Not on file    Relationship status: Not on file  . Intimate partner violence    Fear of current or ex partner: Not on file    Emotionally abused: Not on file    Physically abused: Not on file    Forced sexual activity: Not on file  Other Topics Concern  . Not on file  Social History Narrative  . Not on file     Physical Exam: Unable to perform because this was a "telemed visit"  due to current Covid-19 pandemic  Assessment and plan: 78 y.o. female with positive Cologuard colon cancer screening test  She was not really due for colon cancer screening however she underwent a Cologuard stool test by her primary care physician in [place].  That Cologuard stool test was positive.  I recommended that she undergo a colonoscopy at her soonest convenience to check for underlying significant neoplasm.  I see no reason for any further blood tests or imaging studies prior to then.  Her colonoscopy 8 years ago was incomplete, I was unable to see her cecum due to patient discomfort and very redundant colon.  I am hoping I will be able to see 100% of her colon this time.  Better sedation method with propofol is what we have been using for many years now and so I hope that to make the difference for her.  Please see the "Patient Instructions" section for addition details about the plan.  Claire Loffler, MD [facility] 06/22/2019, 2:24 PM

## 2019-07-01 ENCOUNTER — Telehealth: Payer: Self-pay | Admitting: Gastroenterology

## 2019-07-01 NOTE — Telephone Encounter (Signed)

## 2019-07-04 ENCOUNTER — Other Ambulatory Visit: Payer: Self-pay

## 2019-07-04 ENCOUNTER — Ambulatory Visit (AMBULATORY_SURGERY_CENTER): Payer: Medicare HMO | Admitting: Gastroenterology

## 2019-07-04 ENCOUNTER — Encounter: Payer: Self-pay | Admitting: Gastroenterology

## 2019-07-04 VITALS — BP 143/74 | HR 67 | Temp 98.9°F | Resp 14 | Ht 61.0 in | Wt 153.0 lb

## 2019-07-04 DIAGNOSIS — K5730 Diverticulosis of large intestine without perforation or abscess without bleeding: Secondary | ICD-10-CM

## 2019-07-04 DIAGNOSIS — R195 Other fecal abnormalities: Secondary | ICD-10-CM | POA: Diagnosis not present

## 2019-07-04 DIAGNOSIS — D122 Benign neoplasm of ascending colon: Secondary | ICD-10-CM

## 2019-07-04 DIAGNOSIS — K648 Other hemorrhoids: Secondary | ICD-10-CM | POA: Diagnosis not present

## 2019-07-04 DIAGNOSIS — E669 Obesity, unspecified: Secondary | ICD-10-CM | POA: Diagnosis not present

## 2019-07-04 DIAGNOSIS — I10 Essential (primary) hypertension: Secondary | ICD-10-CM | POA: Diagnosis not present

## 2019-07-04 DIAGNOSIS — E785 Hyperlipidemia, unspecified: Secondary | ICD-10-CM | POA: Diagnosis not present

## 2019-07-04 MED ORDER — SODIUM CHLORIDE 0.9 % IV SOLN: 500.0000 mL | Freq: Once | INTRAVENOUS | Status: DC

## 2019-07-04 NOTE — Progress Notes (Signed)
Report to PACU, RN, vss, BBS= Clear.  

## 2019-07-04 NOTE — Patient Instructions (Signed)
Handouts given for polyps, diverticulosis and hemorrhoids  YOU HAD AN ENDOSCOPIC PROCEDURE TODAY AT THE [facility]:   Refer to the procedure report that was given to you for any specific questions about what was found during the examination.  If the procedure report does not answer your questions, please call your gastroenterologist to clarify.  If you requested that your care partner not be given the details of your procedure findings, then the procedure report has been included in a sealed envelope for you to review at your convenience later.  YOU SHOULD EXPECT: Some feelings of bloating in the abdomen. Passage of more gas than usual.  Walking can help get rid of the air that was put into your GI tract during the procedure and reduce the bloating. If you had a lower endoscopy (such as a colonoscopy or flexible sigmoidoscopy) you may notice spotting of blood in your stool or on the toilet paper. If you underwent a bowel prep for your procedure, you may not have a normal bowel movement for a few days.  Please Note:  You might notice some irritation and congestion in your nose or some drainage.  This is from the oxygen used during your procedure.  There is no need for concern and it should clear up in a day or so.  SYMPTOMS TO REPORT IMMEDIATELY:   Following lower endoscopy (colonoscopy or flexible sigmoidoscopy):  Excessive amounts of blood in the stool  Significant tenderness or worsening of abdominal pains  Swelling of the abdomen that is new, acute  Fever of 100F or higher  For urgent or emergent issues, a gastroenterologist can be reached at any hour by calling [phone].   DIET:  We do recommend a small meal at first, but then you may proceed to your regular diet.  Drink plenty of fluids but you should avoid alcoholic beverages for 24 hours.  ACTIVITY:  You should plan to take it easy for the rest of today and you should NOT DRIVE or use heavy machinery until tomorrow  (because of the sedation medicines used during the test).    FOLLOW UP: Our staff will call the number listed on your records 48-72 hours following your procedure to check on you and address any questions or concerns that you may have regarding the information given to you following your procedure. If we do not reach you, we will leave a message.  We will attempt to reach you two times.  During this call, we will ask if you have developed any symptoms of COVID 19. If you develop any symptoms (ie: fever, flu-like symptoms, shortness of breath, cough etc.) before then, please call [phone].  If you test positive for Covid 19 in the 2 weeks post procedure, please call and report this information to us.    If any biopsies were taken you will be contacted by phone or by letter within the next 1-3 weeks.  Please call us at [phone] if you have not heard about the biopsies in 3 weeks.    SIGNATURES/CONFIDENTIALITY: You and/or your care partner have signed paperwork which will be entered into your electronic medical record.  These signatures attest to the fact that that the information above on your After Visit Summary has been reviewed and is understood.  Full responsibility of the confidentiality of this discharge information lies with you and/or your care-partner. 

## 2019-07-04 NOTE — Progress Notes (Signed)
[place]

## 2019-07-04 NOTE — Progress Notes (Signed)
Called to room to assist during endoscopic procedure.  Patient ID and intended procedure confirmed with present staff. Received instructions for my participation in the procedure from the performing physician.  

## 2019-07-04 NOTE — Op Note (Signed)
[facility] Patient Name: Claire Garcia Procedure Date: 07/04/2019 9:04 AM MRN: [id number] Endoscopist: Milus Banister , MD Age: 78 Referring MD:  Date of Birth: Nov 07, 1941 Gender: Female Account #: [id number] Procedure:                Colonoscopy Indications:              Positive Cologuard test Medicines:                Monitored Anesthesia Care Procedure:                Pre-Anesthesia Assessment:                           - Prior to the procedure, a History and Physical                            was performed, and patient medications and                            allergies were reviewed. The patient's tolerance of                            previous anesthesia was also reviewed. The risks                            and benefits of the procedure and the sedation                            options and risks were discussed with the patient.                            All questions were answered, and informed consent                            was obtained. Prior Anticoagulants: The patient has                            taken no previous anticoagulant or antiplatelet                            agents. ASA Grade Assessment: II - A patient with                            mild systemic disease. After reviewing the risks                            and benefits, the patient was deemed in                            satisfactory condition to undergo the procedure.                           After obtaining informed consent, the colonoscope  was passed under direct vision. Throughout the                            procedure, the patient's blood pressure, pulse, and                            oxygen saturations were monitored continuously. The                            Colonoscope was introduced through the anus and                            advanced to the the cecum, identified by                            appendiceal orifice and ileocecal valve. The                            colonoscopy was performed without difficulty. The                            patient tolerated the procedure well. The quality                            of the bowel preparation was good. The ileocecal                            valve, appendiceal orifice, and rectum were                            photographed. Scope In: 9:06:03 AM Scope Out: 9:18:30 AM Scope Withdrawal Time: 0 hours 8 minutes 24 seconds  Total Procedure Duration: 0 hours 12 minutes 27 seconds  Findings:                 Two sessile polyps were found in the ascending                            colon. The polyps were 1 to 2 mm in size. These                            polyps were removed with a cold biopsy forceps.                            Resection and retrieval were complete.                           Multiple small-mouthed diverticula were found in                            the left colon.                           External and internal hemorrhoids were found. The  hemorrhoids were small.                           The exam was otherwise without abnormality on                            direct and retroflexion views. Complications:            No immediate complications. Estimated blood loss:                            None. Estimated Blood Loss:     Estimated blood loss: none. Impression:               - Two 1 to 2 mm polyps in the ascending colon,                            removed with a cold biopsy forceps. Resected and                            retrieved.                           - Diverticulosis in the left colon.                           - External and internal hemorrhoids.                           - The examination was otherwise normal on direct                            and retroflexion views. Recommendation:           - Patient has a contact number available for                            emergencies. The signs and symptoms of potential                             delayed complications were discussed with the                            patient. Return to normal activities tomorrow.                            Written discharge instructions were provided to the                            patient.                           - Resume previous diet.                           - Continue present medications.                           -  Await pathology results. Milus Banister, MD 07/04/2019 9:22:18 AM This report has been signed electronically.

## 2019-07-06 ENCOUNTER — Telehealth: Payer: Self-pay

## 2019-07-06 NOTE — Telephone Encounter (Signed)
Second post procedure follow up call, no answer 

## 2019-07-06 NOTE — Telephone Encounter (Signed)
  Follow up Call-  Call back number 07/04/2019  Post procedure Call Back phone  # [phone] cell  Permission to leave phone message Yes  Some recent data might be hidden     The mailbox is full and cannot accept any messages

## 2019-07-12 ENCOUNTER — Encounter: Payer: Self-pay | Admitting: Gastroenterology

## 2019-07-27 DIAGNOSIS — Z20828 Contact with and (suspected) exposure to other viral communicable diseases: Secondary | ICD-10-CM | POA: Diagnosis not present

## 2019-08-01 DIAGNOSIS — L259 Unspecified contact dermatitis, unspecified cause: Secondary | ICD-10-CM | POA: Diagnosis not present

## 2019-08-01 DIAGNOSIS — L570 Actinic keratosis: Secondary | ICD-10-CM | POA: Diagnosis not present

## 2019-08-01 DIAGNOSIS — Z85828 Personal history of other malignant neoplasm of skin: Secondary | ICD-10-CM | POA: Diagnosis not present

## 2019-08-08 DIAGNOSIS — E782 Mixed hyperlipidemia: Secondary | ICD-10-CM | POA: Diagnosis not present

## 2019-08-08 DIAGNOSIS — I10 Essential (primary) hypertension: Secondary | ICD-10-CM | POA: Diagnosis not present

## 2019-08-21 DIAGNOSIS — I10 Essential (primary) hypertension: Secondary | ICD-10-CM | POA: Diagnosis not present

## 2019-08-21 DIAGNOSIS — Z8249 Family history of ischemic heart disease and other diseases of the circulatory system: Secondary | ICD-10-CM | POA: Diagnosis not present

## 2019-08-21 DIAGNOSIS — Z79899 Other long term (current) drug therapy: Secondary | ICD-10-CM | POA: Diagnosis not present

## 2019-08-21 DIAGNOSIS — I6523 Occlusion and stenosis of bilateral carotid arteries: Secondary | ICD-10-CM | POA: Diagnosis not present

## 2019-08-21 DIAGNOSIS — R42 Dizziness and giddiness: Secondary | ICD-10-CM | POA: Diagnosis not present

## 2019-08-21 DIAGNOSIS — R110 Nausea: Secondary | ICD-10-CM | POA: Diagnosis not present

## 2019-09-07 DIAGNOSIS — I10 Essential (primary) hypertension: Secondary | ICD-10-CM | POA: Diagnosis not present

## 2019-09-07 DIAGNOSIS — E782 Mixed hyperlipidemia: Secondary | ICD-10-CM | POA: Diagnosis not present

## 2019-09-22 DIAGNOSIS — Z6830 Body mass index (BMI) 30.0-30.9, adult: Secondary | ICD-10-CM | POA: Diagnosis not present

## 2019-09-22 DIAGNOSIS — H81393 Other peripheral vertigo, bilateral: Secondary | ICD-10-CM | POA: Diagnosis not present

## 2019-09-22 DIAGNOSIS — I6523 Occlusion and stenosis of bilateral carotid arteries: Secondary | ICD-10-CM | POA: Diagnosis not present

## 2019-10-27 DIAGNOSIS — R69 Illness, unspecified: Secondary | ICD-10-CM | POA: Diagnosis not present

## 2019-11-14 DIAGNOSIS — E559 Vitamin D deficiency, unspecified: Secondary | ICD-10-CM | POA: Diagnosis not present

## 2019-11-14 DIAGNOSIS — E782 Mixed hyperlipidemia: Secondary | ICD-10-CM | POA: Diagnosis not present

## 2019-11-14 DIAGNOSIS — I10 Essential (primary) hypertension: Secondary | ICD-10-CM | POA: Diagnosis not present

## 2019-11-14 DIAGNOSIS — E870 Hyperosmolality and hypernatremia: Secondary | ICD-10-CM | POA: Diagnosis not present

## 2019-12-06 DIAGNOSIS — E782 Mixed hyperlipidemia: Secondary | ICD-10-CM | POA: Diagnosis not present

## 2019-12-06 DIAGNOSIS — Z6830 Body mass index (BMI) 30.0-30.9, adult: Secondary | ICD-10-CM | POA: Diagnosis not present

## 2019-12-06 DIAGNOSIS — I6523 Occlusion and stenosis of bilateral carotid arteries: Secondary | ICD-10-CM | POA: Diagnosis not present

## 2019-12-06 DIAGNOSIS — E559 Vitamin D deficiency, unspecified: Secondary | ICD-10-CM | POA: Diagnosis not present

## 2019-12-06 DIAGNOSIS — D126 Benign neoplasm of colon, unspecified: Secondary | ICD-10-CM | POA: Diagnosis not present

## 2019-12-06 DIAGNOSIS — I10 Essential (primary) hypertension: Secondary | ICD-10-CM | POA: Diagnosis not present

## 2019-12-06 DIAGNOSIS — K5730 Diverticulosis of large intestine without perforation or abscess without bleeding: Secondary | ICD-10-CM | POA: Diagnosis not present

## 2019-12-06 DIAGNOSIS — E870 Hyperosmolality and hypernatremia: Secondary | ICD-10-CM | POA: Diagnosis not present

## 2019-12-08 DIAGNOSIS — E782 Mixed hyperlipidemia: Secondary | ICD-10-CM | POA: Diagnosis not present

## 2019-12-08 DIAGNOSIS — I10 Essential (primary) hypertension: Secondary | ICD-10-CM | POA: Diagnosis not present

## 2020-01-06 DIAGNOSIS — B029 Zoster without complications: Secondary | ICD-10-CM | POA: Diagnosis not present

## 2020-01-06 DIAGNOSIS — R29898 Other symptoms and signs involving the musculoskeletal system: Secondary | ICD-10-CM | POA: Diagnosis not present

## 2020-01-06 DIAGNOSIS — R202 Paresthesia of skin: Secondary | ICD-10-CM | POA: Diagnosis not present

## 2020-01-06 DIAGNOSIS — Z6831 Body mass index (BMI) 31.0-31.9, adult: Secondary | ICD-10-CM | POA: Diagnosis not present

## 2020-01-12 ENCOUNTER — Other Ambulatory Visit (HOSPITAL_COMMUNITY): Payer: Self-pay | Admitting: Family Medicine

## 2020-01-12 ENCOUNTER — Other Ambulatory Visit: Payer: Self-pay | Admitting: Family Medicine

## 2020-01-12 DIAGNOSIS — R2689 Other abnormalities of gait and mobility: Secondary | ICD-10-CM

## 2020-01-12 DIAGNOSIS — R202 Paresthesia of skin: Secondary | ICD-10-CM

## 2020-01-26 ENCOUNTER — Ambulatory Visit (HOSPITAL_COMMUNITY): Payer: Medicare HMO

## 2020-01-30 DIAGNOSIS — Z85828 Personal history of other malignant neoplasm of skin: Secondary | ICD-10-CM | POA: Diagnosis not present

## 2020-01-30 DIAGNOSIS — L570 Actinic keratosis: Secondary | ICD-10-CM | POA: Diagnosis not present

## 2020-01-30 DIAGNOSIS — L821 Other seborrheic keratosis: Secondary | ICD-10-CM | POA: Diagnosis not present

## 2020-02-03 DIAGNOSIS — E7849 Other hyperlipidemia: Secondary | ICD-10-CM | POA: Diagnosis not present

## 2020-02-03 DIAGNOSIS — I10 Essential (primary) hypertension: Secondary | ICD-10-CM | POA: Diagnosis not present

## 2020-02-07 DIAGNOSIS — R69 Illness, unspecified: Secondary | ICD-10-CM | POA: Diagnosis not present

## 2020-02-07 DIAGNOSIS — R809 Proteinuria, unspecified: Secondary | ICD-10-CM | POA: Diagnosis not present

## 2020-02-07 DIAGNOSIS — E559 Vitamin D deficiency, unspecified: Secondary | ICD-10-CM | POA: Diagnosis not present

## 2020-02-09 DIAGNOSIS — R29898 Other symptoms and signs involving the musculoskeletal system: Secondary | ICD-10-CM | POA: Diagnosis not present

## 2020-02-09 DIAGNOSIS — Z6830 Body mass index (BMI) 30.0-30.9, adult: Secondary | ICD-10-CM | POA: Diagnosis not present

## 2020-02-09 DIAGNOSIS — J019 Acute sinusitis, unspecified: Secondary | ICD-10-CM | POA: Diagnosis not present

## 2020-02-09 DIAGNOSIS — N3090 Cystitis, unspecified without hematuria: Secondary | ICD-10-CM | POA: Diagnosis not present

## 2020-02-15 ENCOUNTER — Ambulatory Visit (HOSPITAL_COMMUNITY)
Admission: RE | Admit: 2020-02-15 | Discharge: 2020-02-15 | Disposition: A | Discharge status: Home / Self Care | Payer: Medicare HMO | Source: Ambulatory Visit | Attending: Family Medicine | Admitting: Family Medicine

## 2020-02-15 ENCOUNTER — Other Ambulatory Visit: Payer: Self-pay

## 2020-02-15 DIAGNOSIS — R2689 Other abnormalities of gait and mobility: Secondary | ICD-10-CM

## 2020-02-15 DIAGNOSIS — M545 Low back pain: Secondary | ICD-10-CM | POA: Diagnosis not present

## 2020-02-15 DIAGNOSIS — R202 Paresthesia of skin: Secondary | ICD-10-CM | POA: Insufficient documentation

## 2020-03-07 DIAGNOSIS — I10 Essential (primary) hypertension: Secondary | ICD-10-CM | POA: Diagnosis not present

## 2020-03-07 DIAGNOSIS — E7849 Other hyperlipidemia: Secondary | ICD-10-CM | POA: Diagnosis not present

## 2020-03-28 DIAGNOSIS — H5203 Hypermetropia, bilateral: Secondary | ICD-10-CM | POA: Diagnosis not present

## 2020-03-28 DIAGNOSIS — H524 Presbyopia: Secondary | ICD-10-CM | POA: Diagnosis not present

## 2020-03-28 DIAGNOSIS — H353130 Nonexudative age-related macular degeneration, bilateral, stage unspecified: Secondary | ICD-10-CM | POA: Diagnosis not present

## 2020-03-28 DIAGNOSIS — H2513 Age-related nuclear cataract, bilateral: Secondary | ICD-10-CM | POA: Diagnosis not present

## 2020-03-28 DIAGNOSIS — H52223 Regular astigmatism, bilateral: Secondary | ICD-10-CM | POA: Diagnosis not present

## 2020-04-06 DIAGNOSIS — R69 Illness, unspecified: Secondary | ICD-10-CM | POA: Diagnosis not present

## 2020-04-06 DIAGNOSIS — H2513 Age-related nuclear cataract, bilateral: Secondary | ICD-10-CM | POA: Diagnosis not present

## 2020-04-06 DIAGNOSIS — E7849 Other hyperlipidemia: Secondary | ICD-10-CM | POA: Diagnosis not present

## 2020-04-06 DIAGNOSIS — H353112 Nonexudative age-related macular degeneration, right eye, intermediate dry stage: Secondary | ICD-10-CM | POA: Diagnosis not present

## 2020-04-06 DIAGNOSIS — H43813 Vitreous degeneration, bilateral: Secondary | ICD-10-CM | POA: Diagnosis not present

## 2020-04-06 DIAGNOSIS — I10 Essential (primary) hypertension: Secondary | ICD-10-CM | POA: Diagnosis not present

## 2020-04-06 DIAGNOSIS — H353221 Exudative age-related macular degeneration, left eye, with active choroidal neovascularization: Secondary | ICD-10-CM | POA: Diagnosis not present

## 2020-04-13 DIAGNOSIS — H353221 Exudative age-related macular degeneration, left eye, with active choroidal neovascularization: Secondary | ICD-10-CM | POA: Diagnosis not present

## 2020-05-07 DIAGNOSIS — I6523 Occlusion and stenosis of bilateral carotid arteries: Secondary | ICD-10-CM | POA: Diagnosis not present

## 2020-05-07 DIAGNOSIS — E870 Hyperosmolality and hypernatremia: Secondary | ICD-10-CM | POA: Diagnosis not present

## 2020-05-07 DIAGNOSIS — E7849 Other hyperlipidemia: Secondary | ICD-10-CM | POA: Diagnosis not present

## 2020-05-07 DIAGNOSIS — I10 Essential (primary) hypertension: Secondary | ICD-10-CM | POA: Diagnosis not present

## 2020-05-11 DIAGNOSIS — H353112 Nonexudative age-related macular degeneration, right eye, intermediate dry stage: Secondary | ICD-10-CM | POA: Diagnosis not present

## 2020-05-11 DIAGNOSIS — H353221 Exudative age-related macular degeneration, left eye, with active choroidal neovascularization: Secondary | ICD-10-CM | POA: Diagnosis not present

## 2020-05-31 DIAGNOSIS — E870 Hyperosmolality and hypernatremia: Secondary | ICD-10-CM | POA: Diagnosis not present

## 2020-05-31 DIAGNOSIS — E782 Mixed hyperlipidemia: Secondary | ICD-10-CM | POA: Diagnosis not present

## 2020-05-31 DIAGNOSIS — R69 Illness, unspecified: Secondary | ICD-10-CM | POA: Diagnosis not present

## 2020-05-31 DIAGNOSIS — I10 Essential (primary) hypertension: Secondary | ICD-10-CM | POA: Diagnosis not present

## 2020-05-31 DIAGNOSIS — D519 Vitamin B12 deficiency anemia, unspecified: Secondary | ICD-10-CM | POA: Diagnosis not present

## 2020-05-31 DIAGNOSIS — E559 Vitamin D deficiency, unspecified: Secondary | ICD-10-CM | POA: Diagnosis not present

## 2020-06-01 DIAGNOSIS — I10 Essential (primary) hypertension: Secondary | ICD-10-CM | POA: Diagnosis not present

## 2020-06-01 DIAGNOSIS — E782 Mixed hyperlipidemia: Secondary | ICD-10-CM | POA: Diagnosis not present

## 2020-06-01 DIAGNOSIS — D519 Vitamin B12 deficiency anemia, unspecified: Secondary | ICD-10-CM | POA: Diagnosis not present

## 2020-06-01 DIAGNOSIS — E870 Hyperosmolality and hypernatremia: Secondary | ICD-10-CM | POA: Diagnosis not present

## 2020-06-01 DIAGNOSIS — E559 Vitamin D deficiency, unspecified: Secondary | ICD-10-CM | POA: Diagnosis not present

## 2020-06-01 DIAGNOSIS — R69 Illness, unspecified: Secondary | ICD-10-CM | POA: Diagnosis not present

## 2020-06-05 DIAGNOSIS — H353290 Exudative age-related macular degeneration, unspecified eye, stage unspecified: Secondary | ICD-10-CM | POA: Diagnosis not present

## 2020-06-05 DIAGNOSIS — Z6831 Body mass index (BMI) 31.0-31.9, adult: Secondary | ICD-10-CM | POA: Diagnosis not present

## 2020-06-05 DIAGNOSIS — I10 Essential (primary) hypertension: Secondary | ICD-10-CM | POA: Diagnosis not present

## 2020-06-05 DIAGNOSIS — E559 Vitamin D deficiency, unspecified: Secondary | ICD-10-CM | POA: Diagnosis not present

## 2020-06-05 DIAGNOSIS — D126 Benign neoplasm of colon, unspecified: Secondary | ICD-10-CM | POA: Diagnosis not present

## 2020-06-05 DIAGNOSIS — Z0001 Encounter for general adult medical examination with abnormal findings: Secondary | ICD-10-CM | POA: Diagnosis not present

## 2020-06-05 DIAGNOSIS — K5730 Diverticulosis of large intestine without perforation or abscess without bleeding: Secondary | ICD-10-CM | POA: Diagnosis not present

## 2020-06-05 DIAGNOSIS — E782 Mixed hyperlipidemia: Secondary | ICD-10-CM | POA: Diagnosis not present

## 2020-06-12 DIAGNOSIS — H353221 Exudative age-related macular degeneration, left eye, with active choroidal neovascularization: Secondary | ICD-10-CM | POA: Diagnosis not present

## 2020-06-12 DIAGNOSIS — H353112 Nonexudative age-related macular degeneration, right eye, intermediate dry stage: Secondary | ICD-10-CM | POA: Diagnosis not present

## 2020-07-06 DIAGNOSIS — E7849 Other hyperlipidemia: Secondary | ICD-10-CM | POA: Diagnosis not present

## 2020-07-06 DIAGNOSIS — I6523 Occlusion and stenosis of bilateral carotid arteries: Secondary | ICD-10-CM | POA: Diagnosis not present

## 2020-07-06 DIAGNOSIS — I10 Essential (primary) hypertension: Secondary | ICD-10-CM | POA: Diagnosis not present

## 2020-07-06 DIAGNOSIS — E870 Hyperosmolality and hypernatremia: Secondary | ICD-10-CM | POA: Diagnosis not present

## 2020-07-10 DIAGNOSIS — H2513 Age-related nuclear cataract, bilateral: Secondary | ICD-10-CM | POA: Diagnosis not present

## 2020-07-10 DIAGNOSIS — H353221 Exudative age-related macular degeneration, left eye, with active choroidal neovascularization: Secondary | ICD-10-CM | POA: Diagnosis not present

## 2020-07-10 DIAGNOSIS — H43813 Vitreous degeneration, bilateral: Secondary | ICD-10-CM | POA: Diagnosis not present

## 2020-07-10 DIAGNOSIS — H353112 Nonexudative age-related macular degeneration, right eye, intermediate dry stage: Secondary | ICD-10-CM | POA: Diagnosis not present

## 2020-07-17 DIAGNOSIS — Z20828 Contact with and (suspected) exposure to other viral communicable diseases: Secondary | ICD-10-CM | POA: Diagnosis not present

## 2020-07-18 IMAGING — MR MR LUMBAR SPINE W/O CM
4 of 5 series · 13 of 48 positions shown · non-contrast
Comparison: MRI of the lumbar spine April 06, 2005

CLINICAL DATA: Low back pain radiating to the left leg.

EXAM:
MRI LUMBAR SPINE WITHOUT CONTRAST
TECHNIQUE: Multiplanar, multisequence MR imaging of the lumbar spine was
performed. No intravenous contrast was administered.

[Series 4: T2 · sagittal · 4.0mm · 0.43mm/px · 4 of 13 slices shown (1 of 2)]
[im 1/13]
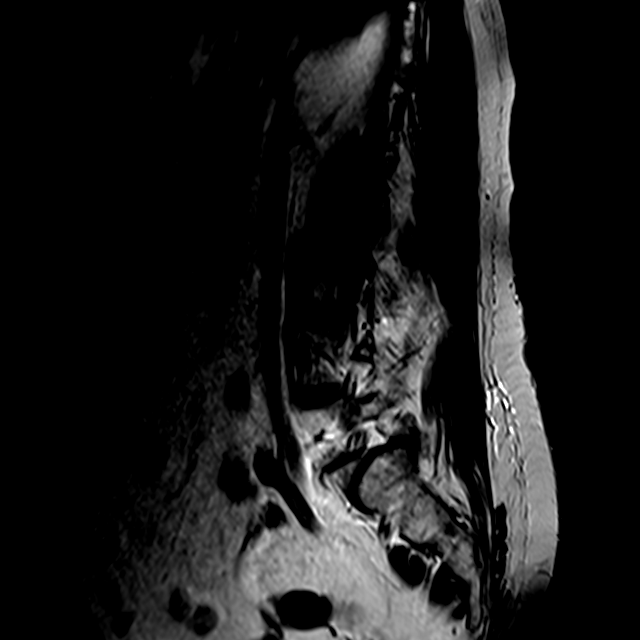
[im 3/13]
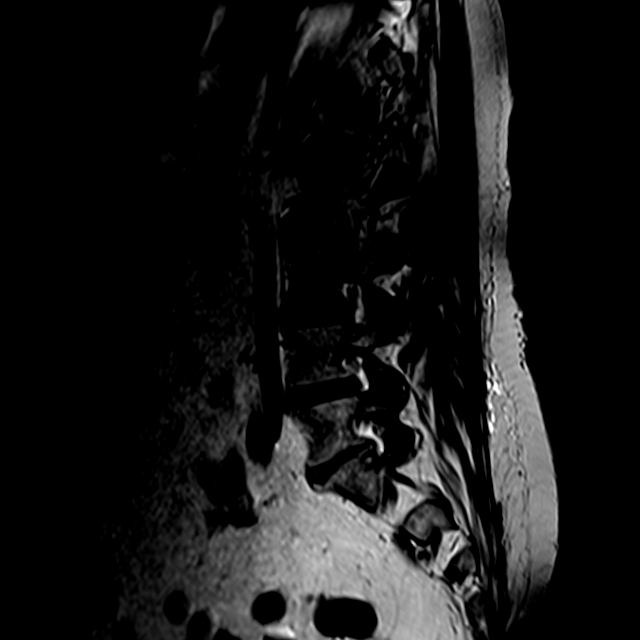
[im 8/13]
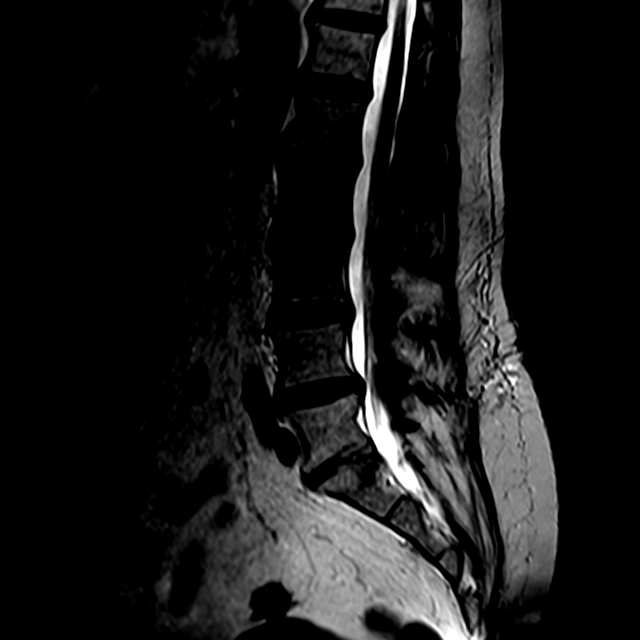
[im 13/13]
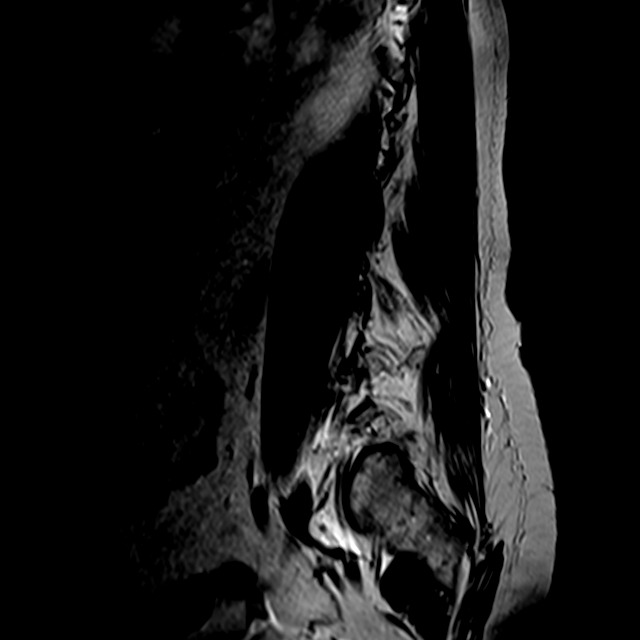

[Series 5: T1 · sagittal · 4.0mm · 0.43mm/px · 3 of 13 slices shown (1 of 2)]
[im 1/13]
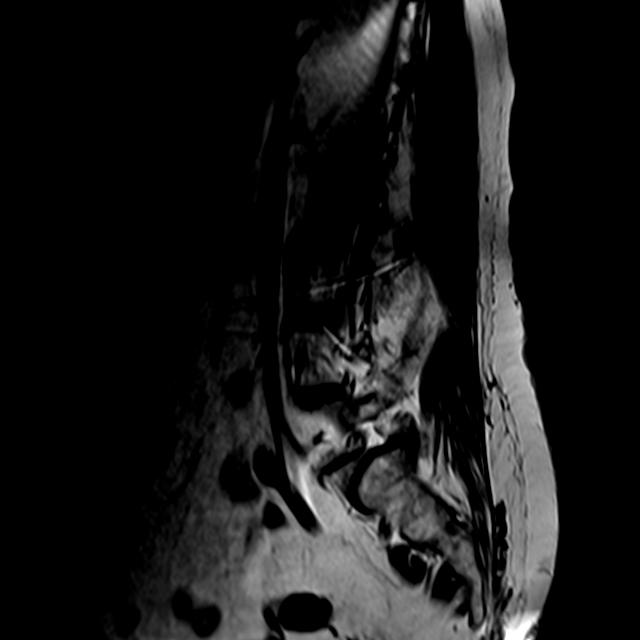
[im 7/13]
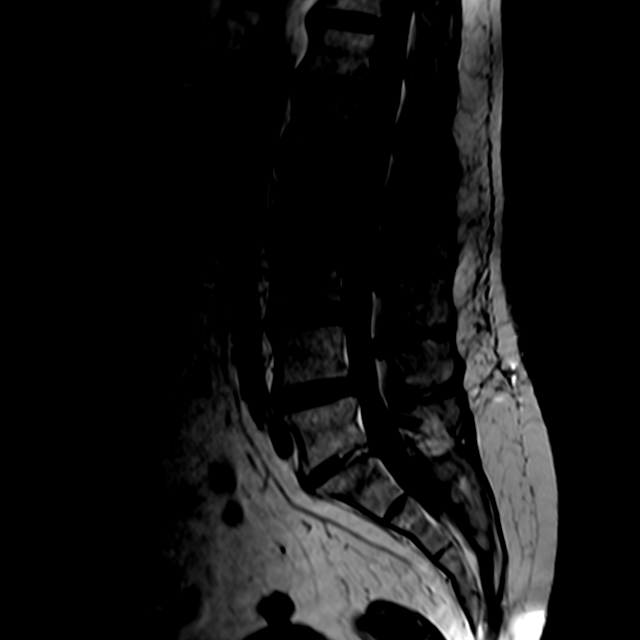
[im 13/13]
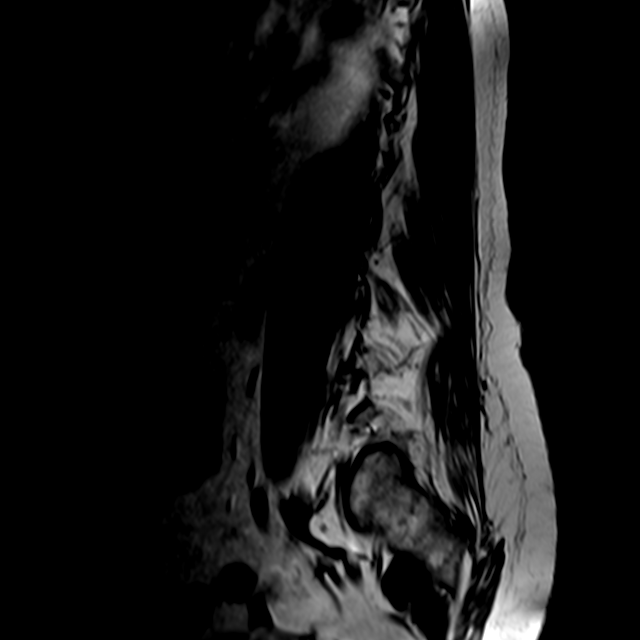

[Series 7: T2 · axial · 4.0mm · 0.22mm/px · z∈[-45,+100]mm · 3 of 40 slices shown (2 of 2)]
[im 6/40]
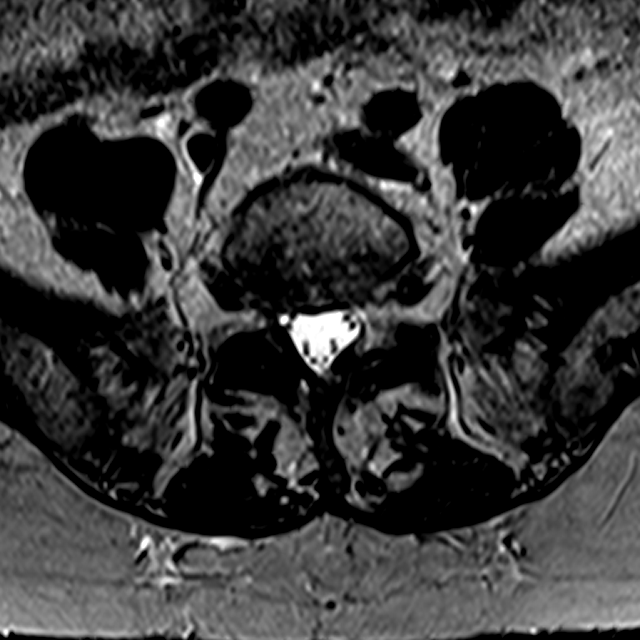
[im 21/40]
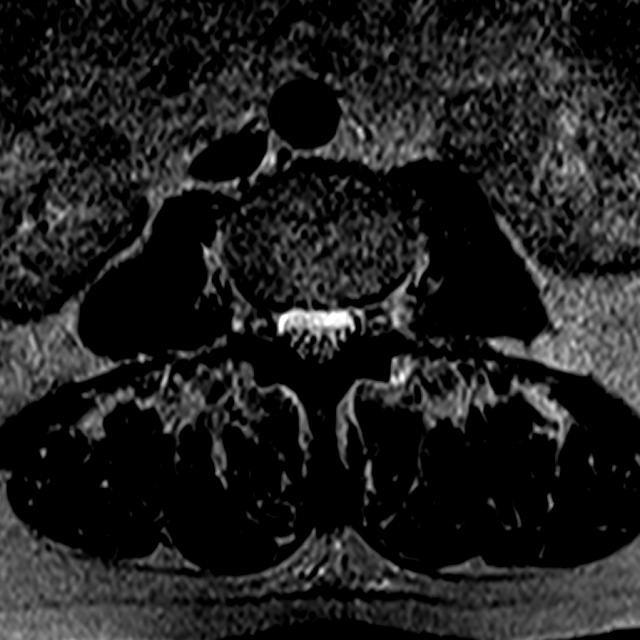
[im 34/40]
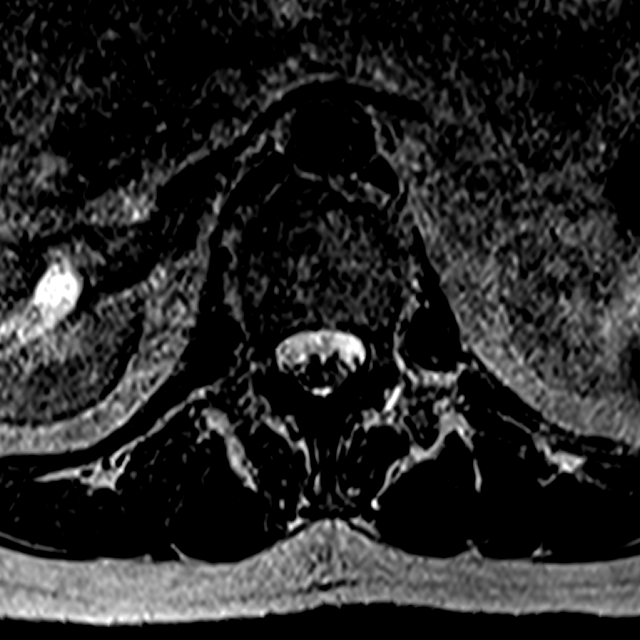

[Series 8: T1 · axial · 4.0mm · 0.22mm/px · z∈[-45,+100]mm · 3 of 40 slices shown (2 of 2)]
[im 6/40]
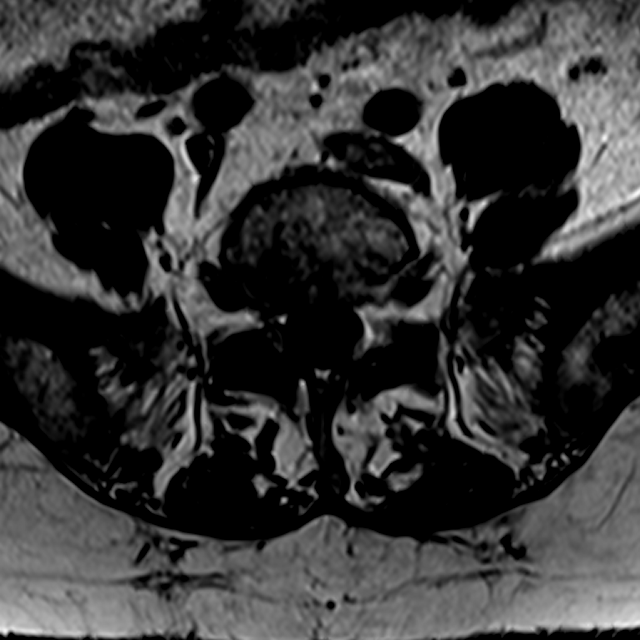
[im 21/40]
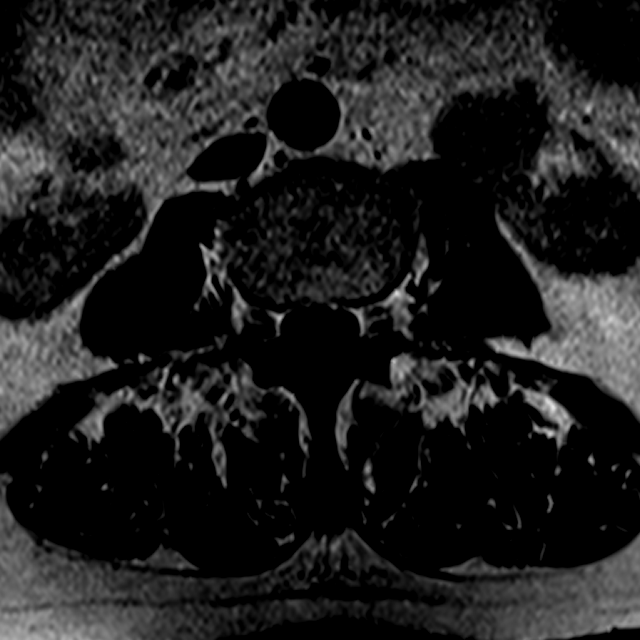
[im 34/40]
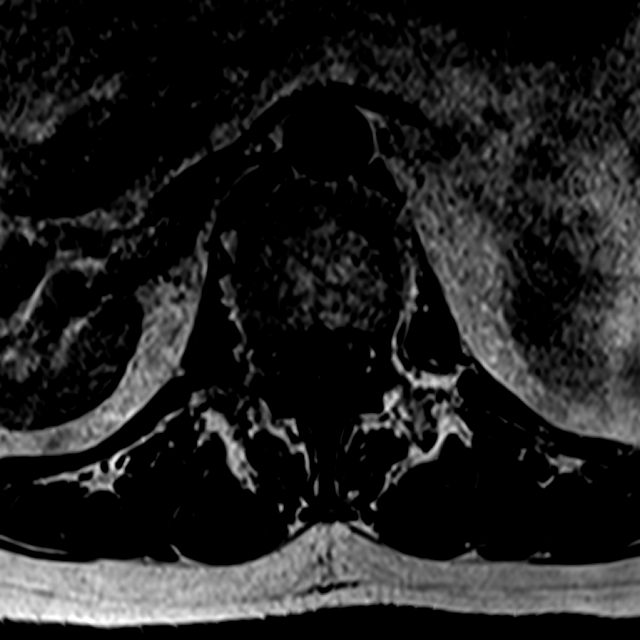

[13 of 48 positions shown; findings below may reference images not displayed]

FINDINGS: Segmentation:  Standard.

Alignment:  Physiologic.

Vertebrae: No fracture, evidence of discitis, or bone lesion. Status
post left laminotomy at L5-S1.

Conus medullaris and cauda equina: Conus extends to the L1-2 level.
Conus and cauda equina appear normal.

Paraspinal and other soft tissues: Negative.

Disc levels:

T12-L1: Shallow disc bulge. No spinal canal or neural foraminal
stenosis.

L1-2: Shallow disc bulge and mild facet degenerative changes. No
spinal canal or neural foraminal stenosis.

L2-3: Shallow disc bulge and mild facet degenerative changes. No
spinal canal or neural foraminal stenosis.

L3-4: Disc bulge, facet degenerative changes and ligamentum flavum
redundancy resulting in mild spinal canal stenosis. There is no
neural foraminal narrowing.

L4-5: Disc bulge with superimposed small central disc protrusion,
facet degenerative changes and ligamentum flavum hypertrophy
resulting in moderate spinal canal stenosis. No significant neural
foraminal narrowing.

L5-S1: Mild facet degenerative changes. No spinal canal or neural
foraminal stenosis.

When compared to prior study, there has been mild interval
progression of the degree of spinal canal stenosis at L4-5 with
interval resolution of the left subarticular disc protrusion at
L5-S1.
IMPRESSION: 1. Mild multilevel degenerative changes of the lumbar spine, worst
at L4-5 where there is moderate spinal canal stenosis.
2. Interval resolution of the left subarticular disc protrusion at
L5-S1.

## 2020-07-23 DIAGNOSIS — R531 Weakness: Secondary | ICD-10-CM | POA: Diagnosis not present

## 2020-07-23 DIAGNOSIS — I10 Essential (primary) hypertension: Secondary | ICD-10-CM | POA: Diagnosis not present

## 2020-07-23 DIAGNOSIS — Z79899 Other long term (current) drug therapy: Secondary | ICD-10-CM | POA: Diagnosis not present

## 2020-07-23 DIAGNOSIS — R05 Cough: Secondary | ICD-10-CM | POA: Diagnosis not present

## 2020-07-23 DIAGNOSIS — R197 Diarrhea, unspecified: Secondary | ICD-10-CM | POA: Diagnosis not present

## 2020-07-23 DIAGNOSIS — U071 COVID-19: Secondary | ICD-10-CM | POA: Diagnosis not present

## 2020-07-23 DIAGNOSIS — J069 Acute upper respiratory infection, unspecified: Secondary | ICD-10-CM | POA: Diagnosis not present

## 2020-07-23 DIAGNOSIS — R110 Nausea: Secondary | ICD-10-CM | POA: Diagnosis not present

## 2020-07-23 DIAGNOSIS — K529 Noninfective gastroenteritis and colitis, unspecified: Secondary | ICD-10-CM | POA: Diagnosis not present

## 2020-07-31 DIAGNOSIS — J90 Pleural effusion, not elsewhere classified: Secondary | ICD-10-CM | POA: Diagnosis not present

## 2020-07-31 DIAGNOSIS — Z8616 Personal history of COVID-19: Secondary | ICD-10-CM | POA: Diagnosis not present

## 2020-07-31 DIAGNOSIS — M545 Low back pain: Secondary | ICD-10-CM | POA: Diagnosis not present

## 2020-07-31 DIAGNOSIS — Z20828 Contact with and (suspected) exposure to other viral communicable diseases: Secondary | ICD-10-CM | POA: Diagnosis not present

## 2020-07-31 DIAGNOSIS — E870 Hyperosmolality and hypernatremia: Secondary | ICD-10-CM | POA: Diagnosis not present

## 2020-07-31 DIAGNOSIS — E782 Mixed hyperlipidemia: Secondary | ICD-10-CM | POA: Diagnosis not present

## 2020-07-31 DIAGNOSIS — I10 Essential (primary) hypertension: Secondary | ICD-10-CM | POA: Diagnosis not present

## 2020-07-31 DIAGNOSIS — E860 Dehydration: Secondary | ICD-10-CM | POA: Diagnosis not present

## 2020-07-31 DIAGNOSIS — R0981 Nasal congestion: Secondary | ICD-10-CM | POA: Diagnosis not present

## 2020-07-31 DIAGNOSIS — J9811 Atelectasis: Secondary | ICD-10-CM | POA: Diagnosis not present

## 2020-07-31 DIAGNOSIS — R05 Cough: Secondary | ICD-10-CM | POA: Diagnosis not present

## 2020-08-06 DIAGNOSIS — L249 Irritant contact dermatitis, unspecified cause: Secondary | ICD-10-CM | POA: Diagnosis not present

## 2020-08-06 DIAGNOSIS — U071 COVID-19: Secondary | ICD-10-CM | POA: Diagnosis not present

## 2020-08-06 DIAGNOSIS — E782 Mixed hyperlipidemia: Secondary | ICD-10-CM | POA: Diagnosis not present

## 2020-08-06 DIAGNOSIS — E860 Dehydration: Secondary | ICD-10-CM | POA: Diagnosis not present

## 2020-08-06 DIAGNOSIS — I10 Essential (primary) hypertension: Secondary | ICD-10-CM | POA: Diagnosis not present

## 2020-08-06 DIAGNOSIS — E870 Hyperosmolality and hypernatremia: Secondary | ICD-10-CM | POA: Diagnosis not present

## 2020-08-06 DIAGNOSIS — J019 Acute sinusitis, unspecified: Secondary | ICD-10-CM | POA: Diagnosis not present

## 2020-08-07 DIAGNOSIS — E7849 Other hyperlipidemia: Secondary | ICD-10-CM | POA: Diagnosis not present

## 2020-08-07 DIAGNOSIS — E870 Hyperosmolality and hypernatremia: Secondary | ICD-10-CM | POA: Diagnosis not present

## 2020-08-07 DIAGNOSIS — I10 Essential (primary) hypertension: Secondary | ICD-10-CM | POA: Diagnosis not present

## 2020-08-07 DIAGNOSIS — I6523 Occlusion and stenosis of bilateral carotid arteries: Secondary | ICD-10-CM | POA: Diagnosis not present

## 2020-08-21 DIAGNOSIS — H353112 Nonexudative age-related macular degeneration, right eye, intermediate dry stage: Secondary | ICD-10-CM | POA: Diagnosis not present

## 2020-08-21 DIAGNOSIS — H353221 Exudative age-related macular degeneration, left eye, with active choroidal neovascularization: Secondary | ICD-10-CM | POA: Diagnosis not present

## 2020-09-03 DIAGNOSIS — L570 Actinic keratosis: Secondary | ICD-10-CM | POA: Diagnosis not present

## 2020-09-13 DIAGNOSIS — R69 Illness, unspecified: Secondary | ICD-10-CM | POA: Diagnosis not present

## 2020-09-18 DIAGNOSIS — H353112 Nonexudative age-related macular degeneration, right eye, intermediate dry stage: Secondary | ICD-10-CM | POA: Diagnosis not present

## 2020-09-18 DIAGNOSIS — H353221 Exudative age-related macular degeneration, left eye, with active choroidal neovascularization: Secondary | ICD-10-CM | POA: Diagnosis not present

## 2020-10-03 DIAGNOSIS — M545 Low back pain, unspecified: Secondary | ICD-10-CM | POA: Diagnosis not present

## 2020-10-03 DIAGNOSIS — Z6831 Body mass index (BMI) 31.0-31.9, adult: Secondary | ICD-10-CM | POA: Diagnosis not present

## 2020-10-06 DIAGNOSIS — E870 Hyperosmolality and hypernatremia: Secondary | ICD-10-CM | POA: Diagnosis not present

## 2020-10-06 DIAGNOSIS — I10 Essential (primary) hypertension: Secondary | ICD-10-CM | POA: Diagnosis not present

## 2020-10-06 DIAGNOSIS — I6523 Occlusion and stenosis of bilateral carotid arteries: Secondary | ICD-10-CM | POA: Diagnosis not present

## 2020-10-06 DIAGNOSIS — E7849 Other hyperlipidemia: Secondary | ICD-10-CM | POA: Diagnosis not present

## 2020-10-16 DIAGNOSIS — H2513 Age-related nuclear cataract, bilateral: Secondary | ICD-10-CM | POA: Diagnosis not present

## 2020-10-16 DIAGNOSIS — H353221 Exudative age-related macular degeneration, left eye, with active choroidal neovascularization: Secondary | ICD-10-CM | POA: Diagnosis not present

## 2020-10-16 DIAGNOSIS — H353112 Nonexudative age-related macular degeneration, right eye, intermediate dry stage: Secondary | ICD-10-CM | POA: Diagnosis not present

## 2020-10-16 DIAGNOSIS — H43813 Vitreous degeneration, bilateral: Secondary | ICD-10-CM | POA: Diagnosis not present

## 2020-11-06 DIAGNOSIS — I10 Essential (primary) hypertension: Secondary | ICD-10-CM | POA: Diagnosis not present

## 2020-11-06 DIAGNOSIS — I6523 Occlusion and stenosis of bilateral carotid arteries: Secondary | ICD-10-CM | POA: Diagnosis not present

## 2020-11-06 DIAGNOSIS — E7849 Other hyperlipidemia: Secondary | ICD-10-CM | POA: Diagnosis not present

## 2020-11-12 DIAGNOSIS — H353221 Exudative age-related macular degeneration, left eye, with active choroidal neovascularization: Secondary | ICD-10-CM | POA: Diagnosis not present

## 2020-11-12 DIAGNOSIS — H353112 Nonexudative age-related macular degeneration, right eye, intermediate dry stage: Secondary | ICD-10-CM | POA: Diagnosis not present

## 2020-11-28 DIAGNOSIS — D519 Vitamin B12 deficiency anemia, unspecified: Secondary | ICD-10-CM | POA: Diagnosis not present

## 2020-11-28 DIAGNOSIS — E782 Mixed hyperlipidemia: Secondary | ICD-10-CM | POA: Diagnosis not present

## 2020-11-28 DIAGNOSIS — E870 Hyperosmolality and hypernatremia: Secondary | ICD-10-CM | POA: Diagnosis not present

## 2020-11-28 DIAGNOSIS — I10 Essential (primary) hypertension: Secondary | ICD-10-CM | POA: Diagnosis not present

## 2020-12-03 DIAGNOSIS — H353290 Exudative age-related macular degeneration, unspecified eye, stage unspecified: Secondary | ICD-10-CM | POA: Diagnosis not present

## 2020-12-03 DIAGNOSIS — E870 Hyperosmolality and hypernatremia: Secondary | ICD-10-CM | POA: Diagnosis not present

## 2020-12-03 DIAGNOSIS — Z6831 Body mass index (BMI) 31.0-31.9, adult: Secondary | ICD-10-CM | POA: Diagnosis not present

## 2020-12-03 DIAGNOSIS — E782 Mixed hyperlipidemia: Secondary | ICD-10-CM | POA: Diagnosis not present

## 2020-12-03 DIAGNOSIS — Z23 Encounter for immunization: Secondary | ICD-10-CM | POA: Diagnosis not present

## 2020-12-03 DIAGNOSIS — I10 Essential (primary) hypertension: Secondary | ICD-10-CM | POA: Diagnosis not present

## 2021-01-05 DIAGNOSIS — I10 Essential (primary) hypertension: Secondary | ICD-10-CM | POA: Diagnosis not present

## 2021-01-05 DIAGNOSIS — E7849 Other hyperlipidemia: Secondary | ICD-10-CM | POA: Diagnosis not present

## 2021-01-05 DIAGNOSIS — E870 Hyperosmolality and hypernatremia: Secondary | ICD-10-CM | POA: Diagnosis not present

## 2021-01-05 DIAGNOSIS — I6523 Occlusion and stenosis of bilateral carotid arteries: Secondary | ICD-10-CM | POA: Diagnosis not present

## 2021-01-07 DIAGNOSIS — H353221 Exudative age-related macular degeneration, left eye, with active choroidal neovascularization: Secondary | ICD-10-CM | POA: Diagnosis not present

## 2021-01-07 DIAGNOSIS — H353112 Nonexudative age-related macular degeneration, right eye, intermediate dry stage: Secondary | ICD-10-CM | POA: Diagnosis not present

## 2021-02-04 DIAGNOSIS — I10 Essential (primary) hypertension: Secondary | ICD-10-CM | POA: Diagnosis not present

## 2021-02-04 DIAGNOSIS — E7849 Other hyperlipidemia: Secondary | ICD-10-CM | POA: Diagnosis not present

## 2021-02-04 DIAGNOSIS — I6523 Occlusion and stenosis of bilateral carotid arteries: Secondary | ICD-10-CM | POA: Diagnosis not present

## 2021-02-04 DIAGNOSIS — E870 Hyperosmolality and hypernatremia: Secondary | ICD-10-CM | POA: Diagnosis not present

## 2021-02-18 DIAGNOSIS — H353112 Nonexudative age-related macular degeneration, right eye, intermediate dry stage: Secondary | ICD-10-CM | POA: Diagnosis not present

## 2021-02-18 DIAGNOSIS — H43813 Vitreous degeneration, bilateral: Secondary | ICD-10-CM | POA: Diagnosis not present

## 2021-02-18 DIAGNOSIS — H353221 Exudative age-related macular degeneration, left eye, with active choroidal neovascularization: Secondary | ICD-10-CM | POA: Diagnosis not present

## 2021-02-18 DIAGNOSIS — H2513 Age-related nuclear cataract, bilateral: Secondary | ICD-10-CM | POA: Diagnosis not present

## 2021-03-06 DIAGNOSIS — I10 Essential (primary) hypertension: Secondary | ICD-10-CM | POA: Diagnosis not present

## 2021-03-06 DIAGNOSIS — I6523 Occlusion and stenosis of bilateral carotid arteries: Secondary | ICD-10-CM | POA: Diagnosis not present

## 2021-03-06 DIAGNOSIS — E7849 Other hyperlipidemia: Secondary | ICD-10-CM | POA: Diagnosis not present

## 2021-03-06 DIAGNOSIS — E870 Hyperosmolality and hypernatremia: Secondary | ICD-10-CM | POA: Diagnosis not present

## 2021-04-01 DIAGNOSIS — H353112 Nonexudative age-related macular degeneration, right eye, intermediate dry stage: Secondary | ICD-10-CM | POA: Diagnosis not present

## 2021-04-01 DIAGNOSIS — H353221 Exudative age-related macular degeneration, left eye, with active choroidal neovascularization: Secondary | ICD-10-CM | POA: Diagnosis not present

## 2021-04-03 DIAGNOSIS — L570 Actinic keratosis: Secondary | ICD-10-CM | POA: Diagnosis not present

## 2021-05-06 DIAGNOSIS — J309 Allergic rhinitis, unspecified: Secondary | ICD-10-CM | POA: Diagnosis not present

## 2021-05-06 DIAGNOSIS — Z20828 Contact with and (suspected) exposure to other viral communicable diseases: Secondary | ICD-10-CM | POA: Diagnosis not present

## 2021-05-10 DIAGNOSIS — J019 Acute sinusitis, unspecified: Secondary | ICD-10-CM | POA: Diagnosis not present

## 2021-05-10 DIAGNOSIS — R059 Cough, unspecified: Secondary | ICD-10-CM | POA: Diagnosis not present

## 2021-05-10 DIAGNOSIS — Z20828 Contact with and (suspected) exposure to other viral communicable diseases: Secondary | ICD-10-CM | POA: Diagnosis not present

## 2021-05-13 DIAGNOSIS — H353112 Nonexudative age-related macular degeneration, right eye, intermediate dry stage: Secondary | ICD-10-CM | POA: Diagnosis not present

## 2021-05-13 DIAGNOSIS — H353221 Exudative age-related macular degeneration, left eye, with active choroidal neovascularization: Secondary | ICD-10-CM | POA: Diagnosis not present

## 2021-05-27 DIAGNOSIS — Z20828 Contact with and (suspected) exposure to other viral communicable diseases: Secondary | ICD-10-CM | POA: Diagnosis not present

## 2021-05-27 DIAGNOSIS — R059 Cough, unspecified: Secondary | ICD-10-CM | POA: Diagnosis not present

## 2021-05-27 DIAGNOSIS — R5383 Other fatigue: Secondary | ICD-10-CM | POA: Diagnosis not present

## 2021-06-24 DIAGNOSIS — H353221 Exudative age-related macular degeneration, left eye, with active choroidal neovascularization: Secondary | ICD-10-CM | POA: Diagnosis not present

## 2021-06-24 DIAGNOSIS — H353112 Nonexudative age-related macular degeneration, right eye, intermediate dry stage: Secondary | ICD-10-CM | POA: Diagnosis not present

## 2021-06-27 DIAGNOSIS — Z6831 Body mass index (BMI) 31.0-31.9, adult: Secondary | ICD-10-CM | POA: Diagnosis not present

## 2021-06-27 DIAGNOSIS — R69 Illness, unspecified: Secondary | ICD-10-CM | POA: Diagnosis not present

## 2021-06-27 DIAGNOSIS — H353290 Exudative age-related macular degeneration, unspecified eye, stage unspecified: Secondary | ICD-10-CM | POA: Diagnosis not present

## 2021-06-27 DIAGNOSIS — Z0001 Encounter for general adult medical examination with abnormal findings: Secondary | ICD-10-CM | POA: Diagnosis not present

## 2021-06-27 DIAGNOSIS — I10 Essential (primary) hypertension: Secondary | ICD-10-CM | POA: Diagnosis not present

## 2021-06-27 DIAGNOSIS — J309 Allergic rhinitis, unspecified: Secondary | ICD-10-CM | POA: Diagnosis not present

## 2021-06-27 DIAGNOSIS — E7849 Other hyperlipidemia: Secondary | ICD-10-CM | POA: Diagnosis not present

## 2021-06-27 DIAGNOSIS — E870 Hyperosmolality and hypernatremia: Secondary | ICD-10-CM | POA: Diagnosis not present

## 2021-07-11 DIAGNOSIS — I10 Essential (primary) hypertension: Secondary | ICD-10-CM | POA: Diagnosis not present

## 2021-07-11 DIAGNOSIS — E559 Vitamin D deficiency, unspecified: Secondary | ICD-10-CM | POA: Diagnosis not present

## 2021-07-11 DIAGNOSIS — D519 Vitamin B12 deficiency anemia, unspecified: Secondary | ICD-10-CM | POA: Diagnosis not present

## 2021-07-11 DIAGNOSIS — E782 Mixed hyperlipidemia: Secondary | ICD-10-CM | POA: Diagnosis not present

## 2021-07-11 DIAGNOSIS — Z1329 Encounter for screening for other suspected endocrine disorder: Secondary | ICD-10-CM | POA: Diagnosis not present

## 2021-07-11 DIAGNOSIS — E7849 Other hyperlipidemia: Secondary | ICD-10-CM | POA: Diagnosis not present

## 2021-07-17 DIAGNOSIS — E7800 Pure hypercholesterolemia, unspecified: Secondary | ICD-10-CM | POA: Diagnosis not present

## 2021-07-17 DIAGNOSIS — H5200 Hypermetropia, unspecified eye: Secondary | ICD-10-CM | POA: Diagnosis not present

## 2021-07-17 DIAGNOSIS — Z0100 Encounter for examination of eyes and vision without abnormal findings: Secondary | ICD-10-CM | POA: Diagnosis not present

## 2021-08-05 DIAGNOSIS — H2513 Age-related nuclear cataract, bilateral: Secondary | ICD-10-CM | POA: Diagnosis not present

## 2021-08-05 DIAGNOSIS — H353112 Nonexudative age-related macular degeneration, right eye, intermediate dry stage: Secondary | ICD-10-CM | POA: Diagnosis not present

## 2021-08-05 DIAGNOSIS — H353221 Exudative age-related macular degeneration, left eye, with active choroidal neovascularization: Secondary | ICD-10-CM | POA: Diagnosis not present

## 2021-08-05 DIAGNOSIS — H43813 Vitreous degeneration, bilateral: Secondary | ICD-10-CM | POA: Diagnosis not present

## 2021-09-16 DIAGNOSIS — H353112 Nonexudative age-related macular degeneration, right eye, intermediate dry stage: Secondary | ICD-10-CM | POA: Diagnosis not present

## 2021-09-16 DIAGNOSIS — H353221 Exudative age-related macular degeneration, left eye, with active choroidal neovascularization: Secondary | ICD-10-CM | POA: Diagnosis not present

## 2021-10-18 DIAGNOSIS — Z1231 Encounter for screening mammogram for malignant neoplasm of breast: Secondary | ICD-10-CM | POA: Diagnosis not present

## 2021-10-28 DIAGNOSIS — H353112 Nonexudative age-related macular degeneration, right eye, intermediate dry stage: Secondary | ICD-10-CM | POA: Diagnosis not present

## 2021-10-28 DIAGNOSIS — H353221 Exudative age-related macular degeneration, left eye, with active choroidal neovascularization: Secondary | ICD-10-CM | POA: Diagnosis not present

## 2021-12-10 DIAGNOSIS — H353112 Nonexudative age-related macular degeneration, right eye, intermediate dry stage: Secondary | ICD-10-CM | POA: Diagnosis not present

## 2021-12-10 DIAGNOSIS — H353221 Exudative age-related macular degeneration, left eye, with active choroidal neovascularization: Secondary | ICD-10-CM | POA: Diagnosis not present

## 2021-12-23 DIAGNOSIS — E7849 Other hyperlipidemia: Secondary | ICD-10-CM | POA: Diagnosis not present

## 2021-12-23 DIAGNOSIS — E782 Mixed hyperlipidemia: Secondary | ICD-10-CM | POA: Diagnosis not present

## 2021-12-23 DIAGNOSIS — E870 Hyperosmolality and hypernatremia: Secondary | ICD-10-CM | POA: Diagnosis not present

## 2021-12-23 DIAGNOSIS — I10 Essential (primary) hypertension: Secondary | ICD-10-CM | POA: Diagnosis not present

## 2021-12-25 DIAGNOSIS — H353290 Exudative age-related macular degeneration, unspecified eye, stage unspecified: Secondary | ICD-10-CM | POA: Diagnosis not present

## 2021-12-25 DIAGNOSIS — I10 Essential (primary) hypertension: Secondary | ICD-10-CM | POA: Diagnosis not present

## 2021-12-25 DIAGNOSIS — E7849 Other hyperlipidemia: Secondary | ICD-10-CM | POA: Diagnosis not present

## 2021-12-25 DIAGNOSIS — Z0001 Encounter for general adult medical examination with abnormal findings: Secondary | ICD-10-CM | POA: Diagnosis not present

## 2021-12-25 DIAGNOSIS — E870 Hyperosmolality and hypernatremia: Secondary | ICD-10-CM | POA: Diagnosis not present

## 2021-12-25 DIAGNOSIS — Z1331 Encounter for screening for depression: Secondary | ICD-10-CM | POA: Diagnosis not present

## 2021-12-25 DIAGNOSIS — Z1389 Encounter for screening for other disorder: Secondary | ICD-10-CM | POA: Diagnosis not present

## 2021-12-25 DIAGNOSIS — D126 Benign neoplasm of colon, unspecified: Secondary | ICD-10-CM | POA: Diagnosis not present

## 2022-01-08 DIAGNOSIS — E860 Dehydration: Secondary | ICD-10-CM | POA: Diagnosis not present

## 2022-01-08 DIAGNOSIS — I10 Essential (primary) hypertension: Secondary | ICD-10-CM | POA: Diagnosis not present

## 2022-01-21 DIAGNOSIS — H353221 Exudative age-related macular degeneration, left eye, with active choroidal neovascularization: Secondary | ICD-10-CM | POA: Diagnosis not present

## 2022-01-21 DIAGNOSIS — H353112 Nonexudative age-related macular degeneration, right eye, intermediate dry stage: Secondary | ICD-10-CM | POA: Diagnosis not present

## 2022-03-03 DIAGNOSIS — L570 Actinic keratosis: Secondary | ICD-10-CM | POA: Diagnosis not present

## 2022-03-11 DIAGNOSIS — H2513 Age-related nuclear cataract, bilateral: Secondary | ICD-10-CM | POA: Diagnosis not present

## 2022-03-11 DIAGNOSIS — H353221 Exudative age-related macular degeneration, left eye, with active choroidal neovascularization: Secondary | ICD-10-CM | POA: Diagnosis not present

## 2022-03-11 DIAGNOSIS — H43813 Vitreous degeneration, bilateral: Secondary | ICD-10-CM | POA: Diagnosis not present

## 2022-03-11 DIAGNOSIS — H353112 Nonexudative age-related macular degeneration, right eye, intermediate dry stage: Secondary | ICD-10-CM | POA: Diagnosis not present

## 2022-05-06 DIAGNOSIS — H353221 Exudative age-related macular degeneration, left eye, with active choroidal neovascularization: Secondary | ICD-10-CM | POA: Diagnosis not present

## 2022-05-06 DIAGNOSIS — H43812 Vitreous degeneration, left eye: Secondary | ICD-10-CM | POA: Diagnosis not present

## 2022-05-06 DIAGNOSIS — H353112 Nonexudative age-related macular degeneration, right eye, intermediate dry stage: Secondary | ICD-10-CM | POA: Diagnosis not present

## 2022-06-03 DIAGNOSIS — J0110 Acute frontal sinusitis, unspecified: Secondary | ICD-10-CM | POA: Diagnosis not present

## 2022-06-03 DIAGNOSIS — J040 Acute laryngitis: Secondary | ICD-10-CM | POA: Diagnosis not present

## 2022-06-03 DIAGNOSIS — R030 Elevated blood-pressure reading, without diagnosis of hypertension: Secondary | ICD-10-CM | POA: Diagnosis not present

## 2022-06-03 DIAGNOSIS — Z20828 Contact with and (suspected) exposure to other viral communicable diseases: Secondary | ICD-10-CM | POA: Diagnosis not present

## 2022-06-23 DIAGNOSIS — Z1329 Encounter for screening for other suspected endocrine disorder: Secondary | ICD-10-CM | POA: Diagnosis not present

## 2022-06-23 DIAGNOSIS — R5383 Other fatigue: Secondary | ICD-10-CM | POA: Diagnosis not present

## 2022-06-23 DIAGNOSIS — E782 Mixed hyperlipidemia: Secondary | ICD-10-CM | POA: Diagnosis not present

## 2022-06-23 DIAGNOSIS — E7849 Other hyperlipidemia: Secondary | ICD-10-CM | POA: Diagnosis not present

## 2022-06-23 DIAGNOSIS — I10 Essential (primary) hypertension: Secondary | ICD-10-CM | POA: Diagnosis not present

## 2022-06-24 DIAGNOSIS — I10 Essential (primary) hypertension: Secondary | ICD-10-CM | POA: Diagnosis not present

## 2022-06-24 DIAGNOSIS — Z13220 Encounter for screening for lipoid disorders: Secondary | ICD-10-CM | POA: Diagnosis not present

## 2022-06-24 DIAGNOSIS — E782 Mixed hyperlipidemia: Secondary | ICD-10-CM | POA: Diagnosis not present

## 2022-06-26 DIAGNOSIS — R69 Illness, unspecified: Secondary | ICD-10-CM | POA: Diagnosis not present

## 2022-06-26 DIAGNOSIS — E7849 Other hyperlipidemia: Secondary | ICD-10-CM | POA: Diagnosis not present

## 2022-06-26 DIAGNOSIS — J302 Other seasonal allergic rhinitis: Secondary | ICD-10-CM | POA: Diagnosis not present

## 2022-06-26 DIAGNOSIS — E870 Hyperosmolality and hypernatremia: Secondary | ICD-10-CM | POA: Diagnosis not present

## 2022-06-26 DIAGNOSIS — D126 Benign neoplasm of colon, unspecified: Secondary | ICD-10-CM | POA: Diagnosis not present

## 2022-06-26 DIAGNOSIS — Z0001 Encounter for general adult medical examination with abnormal findings: Secondary | ICD-10-CM | POA: Diagnosis not present

## 2022-06-26 DIAGNOSIS — H353290 Exudative age-related macular degeneration, unspecified eye, stage unspecified: Secondary | ICD-10-CM | POA: Diagnosis not present

## 2022-06-26 DIAGNOSIS — I10 Essential (primary) hypertension: Secondary | ICD-10-CM | POA: Diagnosis not present

## 2022-06-26 DIAGNOSIS — Z23 Encounter for immunization: Secondary | ICD-10-CM | POA: Diagnosis not present

## 2022-06-26 DIAGNOSIS — Z6830 Body mass index (BMI) 30.0-30.9, adult: Secondary | ICD-10-CM | POA: Diagnosis not present

## 2022-07-15 DIAGNOSIS — H353221 Exudative age-related macular degeneration, left eye, with active choroidal neovascularization: Secondary | ICD-10-CM | POA: Diagnosis not present

## 2022-07-15 DIAGNOSIS — H353112 Nonexudative age-related macular degeneration, right eye, intermediate dry stage: Secondary | ICD-10-CM | POA: Diagnosis not present

## 2022-07-23 DIAGNOSIS — I10 Essential (primary) hypertension: Secondary | ICD-10-CM | POA: Diagnosis not present

## 2022-07-23 DIAGNOSIS — R69 Illness, unspecified: Secondary | ICD-10-CM | POA: Diagnosis not present

## 2022-07-23 DIAGNOSIS — R55 Syncope and collapse: Secondary | ICD-10-CM | POA: Diagnosis not present

## 2022-07-23 DIAGNOSIS — E870 Hyperosmolality and hypernatremia: Secondary | ICD-10-CM | POA: Diagnosis not present

## 2022-07-23 DIAGNOSIS — Z6831 Body mass index (BMI) 31.0-31.9, adult: Secondary | ICD-10-CM | POA: Diagnosis not present

## 2022-07-23 DIAGNOSIS — E7849 Other hyperlipidemia: Secondary | ICD-10-CM | POA: Diagnosis not present

## 2022-08-04 DIAGNOSIS — R55 Syncope and collapse: Secondary | ICD-10-CM | POA: Diagnosis not present

## 2022-08-04 DIAGNOSIS — I6523 Occlusion and stenosis of bilateral carotid arteries: Secondary | ICD-10-CM | POA: Diagnosis not present

## 2022-08-12 DIAGNOSIS — K7689 Other specified diseases of liver: Secondary | ICD-10-CM | POA: Diagnosis not present

## 2022-09-14 DIAGNOSIS — Z79899 Other long term (current) drug therapy: Secondary | ICD-10-CM | POA: Diagnosis not present

## 2022-09-14 DIAGNOSIS — Z743 Need for continuous supervision: Secondary | ICD-10-CM | POA: Diagnosis not present

## 2022-09-14 DIAGNOSIS — R55 Syncope and collapse: Secondary | ICD-10-CM | POA: Diagnosis not present

## 2022-09-14 DIAGNOSIS — I10 Essential (primary) hypertension: Secondary | ICD-10-CM | POA: Diagnosis not present

## 2022-09-14 DIAGNOSIS — K7689 Other specified diseases of liver: Secondary | ICD-10-CM | POA: Diagnosis not present

## 2022-09-14 DIAGNOSIS — Z7902 Long term (current) use of antithrombotics/antiplatelets: Secondary | ICD-10-CM | POA: Diagnosis not present

## 2022-09-14 DIAGNOSIS — R0602 Shortness of breath: Secondary | ICD-10-CM | POA: Diagnosis not present

## 2022-09-14 DIAGNOSIS — R69 Illness, unspecified: Secondary | ICD-10-CM | POA: Diagnosis not present

## 2022-09-14 DIAGNOSIS — R531 Weakness: Secondary | ICD-10-CM | POA: Diagnosis not present

## 2022-09-14 DIAGNOSIS — E785 Hyperlipidemia, unspecified: Secondary | ICD-10-CM | POA: Diagnosis not present

## 2022-09-14 DIAGNOSIS — Z7983 Long term (current) use of bisphosphonates: Secondary | ICD-10-CM | POA: Diagnosis not present

## 2022-09-15 DIAGNOSIS — R69 Illness, unspecified: Secondary | ICD-10-CM | POA: Diagnosis not present

## 2022-09-15 DIAGNOSIS — R55 Syncope and collapse: Secondary | ICD-10-CM | POA: Diagnosis not present

## 2022-09-15 DIAGNOSIS — Z7902 Long term (current) use of antithrombotics/antiplatelets: Secondary | ICD-10-CM | POA: Diagnosis not present

## 2022-09-15 DIAGNOSIS — Z7983 Long term (current) use of bisphosphonates: Secondary | ICD-10-CM | POA: Diagnosis not present

## 2022-09-15 DIAGNOSIS — E785 Hyperlipidemia, unspecified: Secondary | ICD-10-CM | POA: Diagnosis not present

## 2022-09-15 DIAGNOSIS — I10 Essential (primary) hypertension: Secondary | ICD-10-CM | POA: Diagnosis not present

## 2022-09-15 DIAGNOSIS — Z79899 Other long term (current) drug therapy: Secondary | ICD-10-CM | POA: Diagnosis not present

## 2022-09-23 DIAGNOSIS — Z23 Encounter for immunization: Secondary | ICD-10-CM | POA: Diagnosis not present

## 2022-10-07 DIAGNOSIS — H353112 Nonexudative age-related macular degeneration, right eye, intermediate dry stage: Secondary | ICD-10-CM | POA: Diagnosis not present

## 2022-10-07 DIAGNOSIS — H43813 Vitreous degeneration, bilateral: Secondary | ICD-10-CM | POA: Diagnosis not present

## 2022-10-07 DIAGNOSIS — H2513 Age-related nuclear cataract, bilateral: Secondary | ICD-10-CM | POA: Diagnosis not present

## 2022-10-07 DIAGNOSIS — R55 Syncope and collapse: Secondary | ICD-10-CM | POA: Diagnosis not present

## 2022-10-07 DIAGNOSIS — H353221 Exudative age-related macular degeneration, left eye, with active choroidal neovascularization: Secondary | ICD-10-CM | POA: Diagnosis not present

## 2022-10-08 DIAGNOSIS — 419620001 Death: Secondary | SNOMED CT | POA: Diagnosis not present

## 2022-10-08 DEATH — deceased

## 2022-10-09 DIAGNOSIS — R55 Syncope and collapse: Secondary | ICD-10-CM | POA: Diagnosis not present

## 2022-10-13 DIAGNOSIS — R55 Syncope and collapse: Secondary | ICD-10-CM | POA: Diagnosis not present

## 2022-10-13 DIAGNOSIS — E782 Mixed hyperlipidemia: Secondary | ICD-10-CM | POA: Diagnosis not present

## 2022-10-13 DIAGNOSIS — I10 Essential (primary) hypertension: Secondary | ICD-10-CM | POA: Diagnosis not present

## 2022-10-15 DIAGNOSIS — K7689 Other specified diseases of liver: Secondary | ICD-10-CM | POA: Diagnosis not present

## 2022-10-15 DIAGNOSIS — E7849 Other hyperlipidemia: Secondary | ICD-10-CM | POA: Diagnosis not present

## 2022-10-15 DIAGNOSIS — R55 Syncope and collapse: Secondary | ICD-10-CM | POA: Diagnosis not present

## 2022-10-15 DIAGNOSIS — I10 Essential (primary) hypertension: Secondary | ICD-10-CM | POA: Diagnosis not present

## 2022-10-15 DIAGNOSIS — R69 Illness, unspecified: Secondary | ICD-10-CM | POA: Diagnosis not present

## 2022-11-03 DIAGNOSIS — H25813 Combined forms of age-related cataract, bilateral: Secondary | ICD-10-CM | POA: Diagnosis not present

## 2022-11-03 DIAGNOSIS — H353130 Nonexudative age-related macular degeneration, bilateral, stage unspecified: Secondary | ICD-10-CM | POA: Diagnosis not present

## 2022-11-10 DIAGNOSIS — H25811 Combined forms of age-related cataract, right eye: Secondary | ICD-10-CM | POA: Diagnosis not present

## 2022-11-10 DIAGNOSIS — H25812 Combined forms of age-related cataract, left eye: Secondary | ICD-10-CM | POA: Diagnosis not present

## 2022-11-20 DIAGNOSIS — H25812 Combined forms of age-related cataract, left eye: Secondary | ICD-10-CM | POA: Diagnosis not present

## 2022-11-20 DIAGNOSIS — H269 Unspecified cataract: Secondary | ICD-10-CM | POA: Diagnosis not present

## 2022-12-10 DIAGNOSIS — D239 Other benign neoplasm of skin, unspecified: Secondary | ICD-10-CM | POA: Diagnosis not present

## 2022-12-10 DIAGNOSIS — R5383 Other fatigue: Secondary | ICD-10-CM | POA: Diagnosis not present

## 2022-12-10 DIAGNOSIS — Z1283 Encounter for screening for malignant neoplasm of skin: Secondary | ICD-10-CM | POA: Diagnosis not present

## 2022-12-10 DIAGNOSIS — I10 Essential (primary) hypertension: Secondary | ICD-10-CM | POA: Diagnosis not present

## 2022-12-10 DIAGNOSIS — E7849 Other hyperlipidemia: Secondary | ICD-10-CM | POA: Diagnosis not present

## 2022-12-10 DIAGNOSIS — Z85828 Personal history of other malignant neoplasm of skin: Secondary | ICD-10-CM | POA: Diagnosis not present

## 2022-12-10 DIAGNOSIS — L570 Actinic keratosis: Secondary | ICD-10-CM | POA: Diagnosis not present

## 2022-12-15 DIAGNOSIS — I10 Essential (primary) hypertension: Secondary | ICD-10-CM | POA: Diagnosis not present

## 2022-12-15 DIAGNOSIS — K7689 Other specified diseases of liver: Secondary | ICD-10-CM | POA: Diagnosis not present

## 2022-12-15 DIAGNOSIS — R69 Illness, unspecified: Secondary | ICD-10-CM | POA: Diagnosis not present

## 2022-12-15 DIAGNOSIS — H269 Unspecified cataract: Secondary | ICD-10-CM | POA: Diagnosis not present

## 2022-12-15 DIAGNOSIS — Z6831 Body mass index (BMI) 31.0-31.9, adult: Secondary | ICD-10-CM | POA: Diagnosis not present

## 2022-12-15 DIAGNOSIS — Z23 Encounter for immunization: Secondary | ICD-10-CM | POA: Diagnosis not present

## 2022-12-15 DIAGNOSIS — R55 Syncope and collapse: Secondary | ICD-10-CM | POA: Diagnosis not present

## 2022-12-15 DIAGNOSIS — E7849 Other hyperlipidemia: Secondary | ICD-10-CM | POA: Diagnosis not present

## 2022-12-15 DIAGNOSIS — H353290 Exudative age-related macular degeneration, unspecified eye, stage unspecified: Secondary | ICD-10-CM | POA: Diagnosis not present

## 2022-12-15 DIAGNOSIS — Z0001 Encounter for general adult medical examination with abnormal findings: Secondary | ICD-10-CM | POA: Diagnosis not present

## 2022-12-15 DIAGNOSIS — F419 Anxiety disorder, unspecified: Secondary | ICD-10-CM | POA: Diagnosis not present

## 2022-12-23 DIAGNOSIS — K7689 Other specified diseases of liver: Secondary | ICD-10-CM | POA: Diagnosis not present

## 2022-12-30 DIAGNOSIS — H35033 Hypertensive retinopathy, bilateral: Secondary | ICD-10-CM | POA: Diagnosis not present

## 2022-12-30 DIAGNOSIS — H353221 Exudative age-related macular degeneration, left eye, with active choroidal neovascularization: Secondary | ICD-10-CM | POA: Diagnosis not present

## 2022-12-30 DIAGNOSIS — H43813 Vitreous degeneration, bilateral: Secondary | ICD-10-CM | POA: Diagnosis not present

## 2022-12-30 DIAGNOSIS — H353112 Nonexudative age-related macular degeneration, right eye, intermediate dry stage: Secondary | ICD-10-CM | POA: Diagnosis not present

## 2023-02-09 DIAGNOSIS — H25811 Combined forms of age-related cataract, right eye: Secondary | ICD-10-CM | POA: Diagnosis not present

## 2023-02-19 DIAGNOSIS — H25811 Combined forms of age-related cataract, right eye: Secondary | ICD-10-CM | POA: Diagnosis not present

## 2023-02-19 DIAGNOSIS — H269 Unspecified cataract: Secondary | ICD-10-CM | POA: Diagnosis not present

## 2023-02-25 DIAGNOSIS — L309 Dermatitis, unspecified: Secondary | ICD-10-CM | POA: Diagnosis not present

## 2023-02-25 DIAGNOSIS — D239 Other benign neoplasm of skin, unspecified: Secondary | ICD-10-CM | POA: Diagnosis not present

## 2023-02-25 DIAGNOSIS — D485 Neoplasm of uncertain behavior of skin: Secondary | ICD-10-CM | POA: Diagnosis not present

## 2023-02-25 DIAGNOSIS — Z1283 Encounter for screening for malignant neoplasm of skin: Secondary | ICD-10-CM | POA: Diagnosis not present

## 2023-02-25 DIAGNOSIS — L308 Other specified dermatitis: Secondary | ICD-10-CM | POA: Diagnosis not present

## 2023-02-28 DIAGNOSIS — R10819 Abdominal tenderness, unspecified site: Secondary | ICD-10-CM | POA: Diagnosis not present

## 2023-02-28 DIAGNOSIS — Z7689 Persons encountering health services in other specified circumstances: Secondary | ICD-10-CM | POA: Diagnosis not present

## 2023-02-28 DIAGNOSIS — R030 Elevated blood-pressure reading, without diagnosis of hypertension: Secondary | ICD-10-CM | POA: Diagnosis not present

## 2023-02-28 DIAGNOSIS — N3001 Acute cystitis with hematuria: Secondary | ICD-10-CM | POA: Diagnosis not present

## 2023-02-28 DIAGNOSIS — R3915 Urgency of urination: Secondary | ICD-10-CM | POA: Diagnosis not present

## 2023-03-12 DIAGNOSIS — B351 Tinea unguium: Secondary | ICD-10-CM | POA: Diagnosis not present

## 2023-03-12 DIAGNOSIS — L309 Dermatitis, unspecified: Secondary | ICD-10-CM | POA: Diagnosis not present

## 2023-03-17 DIAGNOSIS — H524 Presbyopia: Secondary | ICD-10-CM | POA: Diagnosis not present

## 2023-03-17 DIAGNOSIS — H52223 Regular astigmatism, bilateral: Secondary | ICD-10-CM | POA: Diagnosis not present

## 2023-04-07 DIAGNOSIS — H353221 Exudative age-related macular degeneration, left eye, with active choroidal neovascularization: Secondary | ICD-10-CM | POA: Diagnosis not present

## 2023-04-13 DIAGNOSIS — Z79899 Other long term (current) drug therapy: Secondary | ICD-10-CM | POA: Diagnosis not present

## 2023-04-13 DIAGNOSIS — B351 Tinea unguium: Secondary | ICD-10-CM | POA: Diagnosis not present

## 2023-04-23 DIAGNOSIS — Z961 Presence of intraocular lens: Secondary | ICD-10-CM | POA: Diagnosis not present

## 2023-04-23 DIAGNOSIS — H524 Presbyopia: Secondary | ICD-10-CM | POA: Diagnosis not present

## 2023-04-23 DIAGNOSIS — H5213 Myopia, bilateral: Secondary | ICD-10-CM | POA: Diagnosis not present

## 2023-04-23 DIAGNOSIS — H353130 Nonexudative age-related macular degeneration, bilateral, stage unspecified: Secondary | ICD-10-CM | POA: Diagnosis not present

## 2023-04-23 DIAGNOSIS — H52223 Regular astigmatism, bilateral: Secondary | ICD-10-CM | POA: Diagnosis not present

## 2023-06-08 DIAGNOSIS — Z85828 Personal history of other malignant neoplasm of skin: Secondary | ICD-10-CM | POA: Diagnosis not present

## 2023-06-08 DIAGNOSIS — I10 Essential (primary) hypertension: Secondary | ICD-10-CM | POA: Diagnosis not present

## 2023-06-08 DIAGNOSIS — E559 Vitamin D deficiency, unspecified: Secondary | ICD-10-CM | POA: Diagnosis not present

## 2023-06-08 DIAGNOSIS — B351 Tinea unguium: Secondary | ICD-10-CM | POA: Diagnosis not present

## 2023-06-08 DIAGNOSIS — D485 Neoplasm of uncertain behavior of skin: Secondary | ICD-10-CM | POA: Diagnosis not present

## 2023-06-08 DIAGNOSIS — E870 Hyperosmolality and hypernatremia: Secondary | ICD-10-CM | POA: Diagnosis not present

## 2023-06-08 DIAGNOSIS — E7849 Other hyperlipidemia: Secondary | ICD-10-CM | POA: Diagnosis not present

## 2023-06-08 DIAGNOSIS — Z79899 Other long term (current) drug therapy: Secondary | ICD-10-CM | POA: Diagnosis not present

## 2023-06-08 DIAGNOSIS — Z1329 Encounter for screening for other suspected endocrine disorder: Secondary | ICD-10-CM | POA: Diagnosis not present

## 2023-06-15 DIAGNOSIS — F419 Anxiety disorder, unspecified: Secondary | ICD-10-CM | POA: Diagnosis not present

## 2023-06-15 DIAGNOSIS — I10 Essential (primary) hypertension: Secondary | ICD-10-CM | POA: Diagnosis not present

## 2023-06-15 DIAGNOSIS — Z6830 Body mass index (BMI) 30.0-30.9, adult: Secondary | ICD-10-CM | POA: Diagnosis not present

## 2023-06-15 DIAGNOSIS — H353290 Exudative age-related macular degeneration, unspecified eye, stage unspecified: Secondary | ICD-10-CM | POA: Diagnosis not present

## 2023-06-15 DIAGNOSIS — K7689 Other specified diseases of liver: Secondary | ICD-10-CM | POA: Diagnosis not present

## 2023-06-15 DIAGNOSIS — E870 Hyperosmolality and hypernatremia: Secondary | ICD-10-CM | POA: Diagnosis not present

## 2023-06-15 DIAGNOSIS — E7849 Other hyperlipidemia: Secondary | ICD-10-CM | POA: Diagnosis not present

## 2023-07-09 DIAGNOSIS — L7212 Trichodermal cyst: Secondary | ICD-10-CM | POA: Diagnosis not present

## 2023-07-09 DIAGNOSIS — D485 Neoplasm of uncertain behavior of skin: Secondary | ICD-10-CM | POA: Diagnosis not present

## 2023-07-28 DIAGNOSIS — H353112 Nonexudative age-related macular degeneration, right eye, intermediate dry stage: Secondary | ICD-10-CM | POA: Diagnosis not present

## 2023-07-28 DIAGNOSIS — Z961 Presence of intraocular lens: Secondary | ICD-10-CM | POA: Diagnosis not present

## 2023-07-28 DIAGNOSIS — H353221 Exudative age-related macular degeneration, left eye, with active choroidal neovascularization: Secondary | ICD-10-CM | POA: Diagnosis not present

## 2023-07-28 DIAGNOSIS — H35033 Hypertensive retinopathy, bilateral: Secondary | ICD-10-CM | POA: Diagnosis not present

## 2023-07-28 DIAGNOSIS — H43813 Vitreous degeneration, bilateral: Secondary | ICD-10-CM | POA: Diagnosis not present

## 2023-09-15 DIAGNOSIS — H35033 Hypertensive retinopathy, bilateral: Secondary | ICD-10-CM | POA: Diagnosis not present

## 2023-09-15 DIAGNOSIS — H43813 Vitreous degeneration, bilateral: Secondary | ICD-10-CM | POA: Diagnosis not present

## 2023-09-15 DIAGNOSIS — H353211 Exudative age-related macular degeneration, right eye, with active choroidal neovascularization: Secondary | ICD-10-CM | POA: Diagnosis not present

## 2023-09-15 DIAGNOSIS — Z961 Presence of intraocular lens: Secondary | ICD-10-CM | POA: Diagnosis not present

## 2023-10-12 DIAGNOSIS — Z6830 Body mass index (BMI) 30.0-30.9, adult: Secondary | ICD-10-CM | POA: Diagnosis not present

## 2023-10-12 DIAGNOSIS — I10 Essential (primary) hypertension: Secondary | ICD-10-CM | POA: Diagnosis not present

## 2023-10-12 DIAGNOSIS — L03113 Cellulitis of right upper limb: Secondary | ICD-10-CM | POA: Diagnosis not present

## 2023-10-26 DIAGNOSIS — H353211 Exudative age-related macular degeneration, right eye, with active choroidal neovascularization: Secondary | ICD-10-CM | POA: Diagnosis not present

## 2023-11-17 DIAGNOSIS — H35033 Hypertensive retinopathy, bilateral: Secondary | ICD-10-CM | POA: Diagnosis not present

## 2023-11-17 DIAGNOSIS — H353231 Exudative age-related macular degeneration, bilateral, with active choroidal neovascularization: Secondary | ICD-10-CM | POA: Diagnosis not present

## 2023-11-17 DIAGNOSIS — H43813 Vitreous degeneration, bilateral: Secondary | ICD-10-CM | POA: Diagnosis not present

## 2023-11-17 DIAGNOSIS — Z961 Presence of intraocular lens: Secondary | ICD-10-CM | POA: Diagnosis not present

## 2024-01-04 DIAGNOSIS — H353211 Exudative age-related macular degeneration, right eye, with active choroidal neovascularization: Secondary | ICD-10-CM | POA: Diagnosis not present

## 2024-01-04 DIAGNOSIS — Z961 Presence of intraocular lens: Secondary | ICD-10-CM | POA: Diagnosis not present

## 2024-01-04 DIAGNOSIS — H43813 Vitreous degeneration, bilateral: Secondary | ICD-10-CM | POA: Diagnosis not present

## 2024-01-04 DIAGNOSIS — H353231 Exudative age-related macular degeneration, bilateral, with active choroidal neovascularization: Secondary | ICD-10-CM | POA: Diagnosis not present

## 2024-01-04 DIAGNOSIS — H35033 Hypertensive retinopathy, bilateral: Secondary | ICD-10-CM | POA: Diagnosis not present

## 2024-01-04 DIAGNOSIS — H353221 Exudative age-related macular degeneration, left eye, with active choroidal neovascularization: Secondary | ICD-10-CM | POA: Diagnosis not present

## 2024-01-06 DIAGNOSIS — L570 Actinic keratosis: Secondary | ICD-10-CM | POA: Diagnosis not present

## 2024-01-06 DIAGNOSIS — L821 Other seborrheic keratosis: Secondary | ICD-10-CM | POA: Diagnosis not present

## 2024-01-06 DIAGNOSIS — Z85828 Personal history of other malignant neoplasm of skin: Secondary | ICD-10-CM | POA: Diagnosis not present

## 2024-01-06 DIAGNOSIS — L719 Rosacea, unspecified: Secondary | ICD-10-CM | POA: Diagnosis not present

## 2024-03-11 DIAGNOSIS — H353231 Exudative age-related macular degeneration, bilateral, with active choroidal neovascularization: Secondary | ICD-10-CM | POA: Diagnosis not present

## 2024-05-04 DIAGNOSIS — H524 Presbyopia: Secondary | ICD-10-CM | POA: Diagnosis not present

## 2024-05-04 DIAGNOSIS — Z961 Presence of intraocular lens: Secondary | ICD-10-CM | POA: Diagnosis not present

## 2024-05-04 DIAGNOSIS — H5203 Hypermetropia, bilateral: Secondary | ICD-10-CM | POA: Diagnosis not present

## 2024-05-04 DIAGNOSIS — H52223 Regular astigmatism, bilateral: Secondary | ICD-10-CM | POA: Diagnosis not present

## 2024-05-04 DIAGNOSIS — H353132 Nonexudative age-related macular degeneration, bilateral, intermediate dry stage: Secondary | ICD-10-CM | POA: Diagnosis not present

## 2024-05-05 DIAGNOSIS — H52223 Regular astigmatism, bilateral: Secondary | ICD-10-CM | POA: Diagnosis not present

## 2024-05-05 DIAGNOSIS — H524 Presbyopia: Secondary | ICD-10-CM | POA: Diagnosis not present

## 2024-05-09 DIAGNOSIS — H43813 Vitreous degeneration, bilateral: Secondary | ICD-10-CM | POA: Diagnosis not present

## 2024-05-09 DIAGNOSIS — H353231 Exudative age-related macular degeneration, bilateral, with active choroidal neovascularization: Secondary | ICD-10-CM | POA: Diagnosis not present

## 2024-05-09 DIAGNOSIS — H35033 Hypertensive retinopathy, bilateral: Secondary | ICD-10-CM | POA: Diagnosis not present

## 2024-05-09 DIAGNOSIS — Z961 Presence of intraocular lens: Secondary | ICD-10-CM | POA: Diagnosis not present

## 2024-07-06 DIAGNOSIS — Z79899 Other long term (current) drug therapy: Secondary | ICD-10-CM | POA: Diagnosis not present

## 2024-07-06 DIAGNOSIS — L719 Rosacea, unspecified: Secondary | ICD-10-CM | POA: Diagnosis not present

## 2024-08-01 DIAGNOSIS — H353231 Exudative age-related macular degeneration, bilateral, with active choroidal neovascularization: Secondary | ICD-10-CM | POA: Diagnosis not present

## 2024-08-18 DIAGNOSIS — M810 Age-related osteoporosis without current pathological fracture: Secondary | ICD-10-CM | POA: Diagnosis not present

## 2024-08-18 DIAGNOSIS — Z780 Asymptomatic menopausal state: Secondary | ICD-10-CM | POA: Diagnosis not present

## 2024-08-29 DIAGNOSIS — N281 Cyst of kidney, acquired: Secondary | ICD-10-CM | POA: Diagnosis not present

## 2024-08-29 DIAGNOSIS — K7689 Other specified diseases of liver: Secondary | ICD-10-CM | POA: Diagnosis not present

## 2024-08-29 DIAGNOSIS — R932 Abnormal findings on diagnostic imaging of liver and biliary tract: Secondary | ICD-10-CM | POA: Diagnosis not present

## 2024-08-29 DIAGNOSIS — N200 Calculus of kidney: Secondary | ICD-10-CM | POA: Diagnosis not present

## 2024-08-30 DIAGNOSIS — H353211 Exudative age-related macular degeneration, right eye, with active choroidal neovascularization: Secondary | ICD-10-CM | POA: Diagnosis not present

## 2024-09-12 DIAGNOSIS — K7689 Other specified diseases of liver: Secondary | ICD-10-CM | POA: Diagnosis not present

## 2024-09-12 DIAGNOSIS — Z6831 Body mass index (BMI) 31.0-31.9, adult: Secondary | ICD-10-CM | POA: Diagnosis not present

## 2024-09-12 DIAGNOSIS — F4321 Adjustment disorder with depressed mood: Secondary | ICD-10-CM | POA: Diagnosis not present

## 2024-09-12 DIAGNOSIS — Z20828 Contact with and (suspected) exposure to other viral communicable diseases: Secondary | ICD-10-CM | POA: Diagnosis not present

## 2024-09-12 DIAGNOSIS — J019 Acute sinusitis, unspecified: Secondary | ICD-10-CM | POA: Diagnosis not present

## 2024-09-12 DIAGNOSIS — R519 Headache, unspecified: Secondary | ICD-10-CM | POA: Diagnosis not present

## 2024-10-10 DIAGNOSIS — H353231 Exudative age-related macular degeneration, bilateral, with active choroidal neovascularization: Secondary | ICD-10-CM | POA: Diagnosis not present

## 2024-10-10 DIAGNOSIS — H43813 Vitreous degeneration, bilateral: Secondary | ICD-10-CM | POA: Diagnosis not present

## 2024-10-10 DIAGNOSIS — Z961 Presence of intraocular lens: Secondary | ICD-10-CM | POA: Diagnosis not present

## 2024-10-10 DIAGNOSIS — H35033 Hypertensive retinopathy, bilateral: Secondary | ICD-10-CM | POA: Diagnosis not present

## 2024-11-07 DIAGNOSIS — H353211 Exudative age-related macular degeneration, right eye, with active choroidal neovascularization: Secondary | ICD-10-CM | POA: Diagnosis not present
